# Patient Record
Sex: Female | Born: 1990 | Race: White | Hispanic: No | Marital: Single | Smoking: Never smoker
Health system: Southern US, Community
[De-identification: ages and names within clinical notes are randomized; demographics above are authoritative.]

## PROBLEM LIST (undated history)

## (undated) DIAGNOSIS — N926 Irregular menstruation, unspecified: Secondary | ICD-10-CM

## (undated) DIAGNOSIS — N930 Postcoital and contact bleeding: Secondary | ICD-10-CM

## (undated) DIAGNOSIS — E669 Obesity, unspecified: Secondary | ICD-10-CM

## (undated) DIAGNOSIS — L732 Hidradenitis suppurativa: Secondary | ICD-10-CM

## (undated) DIAGNOSIS — Z8619 Personal history of other infectious and parasitic diseases: Secondary | ICD-10-CM

## (undated) DIAGNOSIS — J45909 Unspecified asthma, uncomplicated: Secondary | ICD-10-CM

## (undated) DIAGNOSIS — N898 Other specified noninflammatory disorders of vagina: Secondary | ICD-10-CM

## (undated) DIAGNOSIS — B9689 Other specified bacterial agents as the cause of diseases classified elsewhere: Secondary | ICD-10-CM

## (undated) DIAGNOSIS — A749 Chlamydial infection, unspecified: Secondary | ICD-10-CM

## (undated) DIAGNOSIS — N390 Urinary tract infection, site not specified: Secondary | ICD-10-CM

## (undated) DIAGNOSIS — N760 Acute vaginitis: Secondary | ICD-10-CM

## (undated) DIAGNOSIS — E069 Thyroiditis, unspecified: Secondary | ICD-10-CM

## (undated) HISTORY — DX: Hidradenitis suppurativa: L73.2

## (undated) HISTORY — DX: Irregular menstruation, unspecified: N92.6

## (undated) HISTORY — PX: EYE SURGERY: SHX253

## (undated) HISTORY — DX: Urinary tract infection, site not specified: N39.0

## (undated) HISTORY — DX: Other specified noninflammatory disorders of vagina: N89.8

## (undated) HISTORY — DX: Personal history of other infectious and parasitic diseases: Z86.19

## (undated) HISTORY — DX: Chlamydial infection, unspecified: A74.9

## (undated) HISTORY — DX: Thyroiditis, unspecified: E06.9

## (undated) HISTORY — DX: Postcoital and contact bleeding: N93.0

## (undated) HISTORY — DX: Other specified bacterial agents as the cause of diseases classified elsewhere: B96.89

## (undated) HISTORY — DX: Obesity, unspecified: E66.9

## (undated) HISTORY — DX: Acute vaginitis: N76.0

---
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
## 2012-05-05 ENCOUNTER — Encounter (HOSPITAL_COMMUNITY): Payer: Self-pay | Admitting: *Deleted

## 2012-05-05 ENCOUNTER — Emergency Department (HOSPITAL_COMMUNITY)
Admission: EM | Admit: 2012-05-05 | Discharge: 2012-05-05 | Disposition: A | Discharge status: Home / Self Care | Payer: Medicaid Other | Attending: Emergency Medicine | Admitting: Emergency Medicine

## 2012-05-05 DIAGNOSIS — J039 Acute tonsillitis, unspecified: Secondary | ICD-10-CM | POA: Insufficient documentation

## 2012-05-05 DIAGNOSIS — J45909 Unspecified asthma, uncomplicated: Secondary | ICD-10-CM | POA: Insufficient documentation

## 2012-05-05 DIAGNOSIS — R509 Fever, unspecified: Secondary | ICD-10-CM | POA: Insufficient documentation

## 2012-05-05 HISTORY — DX: Unspecified asthma, uncomplicated: J45.909

## 2012-05-05 LAB — RAPID STREP SCREEN (MED CTR MEBANE ONLY): Streptococcus, Group A Screen (Direct): NEGATIVE

## 2012-05-05 MED ORDER — ACETAMINOPHEN 500 MG PO TABS
1000.0000 mg | ORAL_TABLET | Freq: Once | ORAL | Status: AC
  Administered 2012-05-05: 1000 mg via ORAL
  Filled 2012-05-05: qty 2

## 2012-05-05 MED ORDER — AMOXICILLIN 250 MG PO CAPS
500.0000 mg | ORAL_CAPSULE | Freq: Once | ORAL | Status: AC
  Administered 2012-05-05: 500 mg via ORAL
  Filled 2012-05-05: qty 2

## 2012-05-05 MED ORDER — AMOXICILLIN 500 MG PO CAPS: 500.0000 mg | ORAL_CAPSULE | Freq: Three times a day (TID) | ORAL | Status: AC

## 2012-05-05 NOTE — ED Provider Notes (Signed)
History     CSN: [id number]  Arrival date & time 05/05/12  1521   First MD Initiated Contact with Patient 05/05/12 1540      Chief Complaint  Patient presents with  . Sore Throat    (Consider location/radiation/quality/duration/timing/severity/associated sxs/prior treatment) HPI Comments: Diane Parks presents with a 1 week history of sore throat,  Headache and low grade fevers with nausea.  She also reports shaking chills.  She denies vomiting,  Urinary complaints,  Cough, nasal congestion and  shortness of breath.  She has taken no medicines for her symptoms.  She has found no alleviators,  Swallowing makes the pain worse.  The history is provided by the patient.    Past Medical History  Diagnosis Date  . Asthma     Past Surgical History  Procedure Date  . Eye surgery     History reviewed. No pertinent family history.  History  Substance Use Topics  . Smoking status: Former Games developer  . Smokeless tobacco: Not on file  . Alcohol Use: No    OB History    Grav Para Term Preterm Abortions TAB SAB Ect Mult Living                  Review of Systems  Constitutional: Negative for fever.  HENT: Positive for sore throat. Negative for congestion, rhinorrhea, neck pain, dental problem, voice change and sinus pressure.   Eyes: Negative.   Respiratory: Negative for chest tightness and shortness of breath.   Cardiovascular: Negative for chest pain.  Gastrointestinal: Negative for nausea and abdominal pain.  Genitourinary: Negative.   Musculoskeletal: Negative for joint swelling and arthralgias.  Skin: Negative.  Negative for rash and wound.  Neurological: Negative for dizziness, weakness, light-headedness, numbness and headaches.  Hematological: Negative.   Psychiatric/Behavioral: Negative.     Allergies  Review of patient's allergies indicates no known allergies.  Home Medications   Current Outpatient Rx  Name Route Sig Dispense Refill  . AMOXICILLIN 500 MG PO  CAPS Oral Take 1 capsule (500 mg total) by mouth 3 (three) times daily. 30 capsule 0    BP 116/62  Pulse 119  Temp 99.8 F (37.7 C) (Oral)  Resp 18  Ht 5\' 1"  (1.549 m)  Wt 180 lb (81.647 kg)  BMI 34.01 kg/m2  SpO2 97%  LMP 04/22/2012  Physical Exam  Nursing note and vitals reviewed. Constitutional: She appears well-developed and well-nourished.  HENT:  Head: Normocephalic and atraumatic.  Right Ear: Tympanic membrane and external ear normal.  Left Ear: Tympanic membrane and external ear normal.  Nose: Nose normal.  Mouth/Throat: Oropharyngeal exudate and posterior oropharyngeal erythema present.  Eyes: Conjunctivae are normal.  Neck: Normal range of motion. No tracheal deviation present. No thyromegaly present.  Cardiovascular: Normal rate, regular rhythm, normal heart sounds and intact distal pulses.   Pulmonary/Chest: Effort normal and breath sounds normal. No stridor. She has no wheezes.  Abdominal: Soft. Bowel sounds are normal. There is no tenderness.  Musculoskeletal: Normal range of motion.  Lymphadenopathy:    She has cervical adenopathy.  Neurological: She is alert.  Skin: Skin is warm and dry.  Psychiatric: She has a normal mood and affect.    ED Course  Procedures (including critical care time)   Labs Reviewed  RAPID STREP SCREEN   No results found.   1. Tonsillitis with exudate       MDM  Amoxicillin,  Ibuprofen.  Rest,  Fluids. Recheck if not improving,  Resource guide given.  Burgess Amor, [place] 05/05/12 1646

## 2012-05-05 NOTE — ED Notes (Signed)
Pt c/o headache, sore throat, nausea and shaking for a week. States that she thinks she has had a fever. Pt denies vomiting.

## 2012-05-06 NOTE — ED Provider Notes (Signed)
Medical screening examination/treatment/procedure(s) were performed by non-physician practitioner and as supervising physician I was immediately available for consultation/collaboration.   Laray Anger, DO 05/06/12 Windell Moment

## 2012-08-25 ENCOUNTER — Emergency Department (HOSPITAL_COMMUNITY)
Admission: EM | Admit: 2012-08-25 | Discharge: 2012-08-25 | Disposition: A | Discharge status: Home / Self Care | Payer: Self-pay | Attending: Emergency Medicine | Admitting: Emergency Medicine

## 2012-08-25 ENCOUNTER — Encounter (HOSPITAL_COMMUNITY): Payer: Self-pay

## 2012-08-25 DIAGNOSIS — H6120 Impacted cerumen, unspecified ear: Secondary | ICD-10-CM | POA: Insufficient documentation

## 2012-08-25 DIAGNOSIS — Z87891 Personal history of nicotine dependence: Secondary | ICD-10-CM | POA: Insufficient documentation

## 2012-08-25 DIAGNOSIS — J45909 Unspecified asthma, uncomplicated: Secondary | ICD-10-CM | POA: Insufficient documentation

## 2012-08-25 NOTE — ED Notes (Signed)
Irrigated right ear, states she is able to hear now, advised to use ear wax drops in the future

## 2012-08-25 NOTE — ED Notes (Signed)
Complain of pain in both ears for two weeks. States right hurts worse than left

## 2012-08-26 NOTE — ED Provider Notes (Signed)
History     CSN: [id number]  Arrival date & time 08/25/12  1354   First MD Initiated Contact with Patient 08/25/12 1545      Chief Complaint  Patient presents with  . Otalgia    (Consider location/radiation/quality/duration/timing/severity/associated sxs/prior treatment) HPI Comments: Patient has been applying peroxide to the right ear daily for the past week.  Patient is a 21 y.o. female presenting with ear pain. The history is provided by the patient.  Otalgia The current episode started more than 1 week ago. There is pain in both ears. The problem occurs constantly. The problem has been gradually worsening. There has been no fever. The pain is at a severity of 6/10. Associated symptoms include hearing loss. Pertinent negatives include no ear discharge, no headaches, no rhinorrhea, no sore throat and no neck pain. Associated symptoms comments: Decreased hearing acuity in right ear along with worse pain in the right ear..    Past Medical History  Diagnosis Date  . Asthma     Past Surgical History  Procedure Date  . Eye surgery     No family history on file.  History  Substance Use Topics  . Smoking status: Former Games developer  . Smokeless tobacco: Not on file  . Alcohol Use: No    OB History    Grav Para Term Preterm Abortions TAB SAB Ect Mult Living                  Review of Systems  HENT: Positive for hearing loss and ear pain. Negative for sore throat, rhinorrhea, neck pain and ear discharge.   Neurological: Negative for headaches.    Allergies  Review of patient's allergies indicates no known allergies.  Home Medications  No current outpatient prescriptions on file.  BP 118/70  Pulse 92  Temp 98.3 F (36.8 C) (Oral)  Resp 18  Ht 5\' 1"  (1.549 m)  Wt 180 lb (81.647 kg)  BMI 34.01 kg/m2  SpO2 100%  LMP 08/04/2012  Physical Exam  Constitutional: She is oriented to person, place, and time. She appears well-developed and well-nourished.  HENT:  Head:  Normocephalic and atraumatic.  Right Ear: External ear normal. Decreased hearing is noted.  Left Ear: External ear normal. No decreased hearing is noted.  Nose: Mucosal edema and rhinorrhea present.  Mouth/Throat: Uvula is midline, oropharynx is clear and moist and mucous membranes are normal. No oropharyngeal exudate, posterior oropharyngeal edema, posterior oropharyngeal erythema or tonsillar abscesses.       Cerumen impaction right,  Partial impaction left,  But able to visualize TM.    Eyes: Conjunctivae normal are normal.  Pulmonary/Chest: Effort normal. No respiratory distress. She has no wheezes. She has no rales.  Abdominal: Soft. There is no tenderness.  Musculoskeletal: Normal range of motion.  Neurological: She is alert and oriented to person, place, and time.  Skin: Skin is warm and dry. No rash noted.  Psychiatric: She has a normal mood and affect.    ED Course  Procedures (including critical care time)  Labs Reviewed - No data to display No results found.   1. Cerumen impaction     Cerumen removed per flushing with warm tap water by RN (right ear) - with complete removal of cerumen plug. Resolved pain and hearing  Improved post procedure.  Re-exam - normal TM,  Clear external canal.  Attempt to flush left ear with no successful removal of wax. MDM  Cerumen impaction.  Pt encouraged to apply peroxide to left  ear daily for 5 minutes to soften this side,  See pcp for flushing of the left ear.  Pt agreeable with plan.        Burgess Amor, [place] 08/26/12 1654

## 2012-08-28 NOTE — ED Provider Notes (Signed)
Medical screening examination/treatment/procedure(s) were performed by non-physician practitioner and as supervising physician I was immediately available for consultation/collaboration.   Makaelah Cranfield L Aissatou Fronczak, MD 08/28/12 1448 

## 2012-09-08 ENCOUNTER — Emergency Department (HOSPITAL_COMMUNITY)
Admission: EM | Admit: 2012-09-08 | Discharge: 2012-09-08 | Disposition: A | Discharge status: Home / Self Care | Payer: Medicaid Other | Attending: Emergency Medicine | Admitting: Emergency Medicine

## 2012-09-08 ENCOUNTER — Emergency Department (HOSPITAL_COMMUNITY): Payer: Medicaid Other

## 2012-09-08 ENCOUNTER — Encounter (HOSPITAL_COMMUNITY): Payer: Self-pay | Admitting: *Deleted

## 2012-09-08 DIAGNOSIS — R102 Pelvic and perineal pain: Secondary | ICD-10-CM

## 2012-09-08 DIAGNOSIS — R52 Pain, unspecified: Secondary | ICD-10-CM

## 2012-09-08 DIAGNOSIS — Z87891 Personal history of nicotine dependence: Secondary | ICD-10-CM | POA: Insufficient documentation

## 2012-09-08 DIAGNOSIS — N949 Unspecified condition associated with female genital organs and menstrual cycle: Secondary | ICD-10-CM | POA: Insufficient documentation

## 2012-09-08 DIAGNOSIS — R110 Nausea: Secondary | ICD-10-CM | POA: Insufficient documentation

## 2012-09-08 DIAGNOSIS — J45909 Unspecified asthma, uncomplicated: Secondary | ICD-10-CM | POA: Insufficient documentation

## 2012-09-08 LAB — URINALYSIS, ROUTINE W REFLEX MICROSCOPIC
Bilirubin Urine: NEGATIVE
Leukocytes, UA: NEGATIVE
Nitrite: NEGATIVE
Specific Gravity, Urine: 1.02 (ref 1.005–1.030)
Urobilinogen, UA: 0.2 mg/dL (ref 0.0–1.0)
pH: 6.5 (ref 5.0–8.0)

## 2012-09-08 LAB — PREGNANCY, URINE: Preg Test, Ur: NEGATIVE

## 2012-09-08 NOTE — ED Notes (Signed)
Pt alert & oriented x4, stable gait. Patient  given discharge instructions, paperwork & prescription(s). Patient verbalized understanding. Pt left department w/ no further questions. 

## 2012-09-08 NOTE — ED Notes (Signed)
Pt with lower abd pain with nausea x 1 week, nipple tenderness, states she is 3 days late on menses

## 2012-09-08 NOTE — ED Notes (Signed)
Abdominal pain for over a week 

## 2012-09-08 NOTE — ED Provider Notes (Signed)
History     CSN: [id number]  Arrival date & time 09/08/12  2030   First MD Initiated Contact with Patient 09/08/12 2133      Chief Complaint  Patient presents with  . Abdominal Pain    (Consider location/radiation/quality/duration/timing/severity/associated sxs/prior treatment) HPI Comments: "sharp" intermittent lower abd pain.  No UTI sxs.  LMP a couple weeks ago and normal.  No vaginal bleeding or d/c.  No vomiting or diarrhea.eating and drinking normally.  No fever or chills.  No previous abd surgeries.no h/o ovarian cysts.  Not taking any meds for sxs.  Has PCP in [place] but has not called or made an appt to see her "because she is all the way up in [place]".  Patient is a 21 y.o. female presenting with abdominal pain. The history is provided by the patient. No language interpreter was used.  Abdominal Pain The primary symptoms of the illness include abdominal pain and nausea. The primary symptoms of the illness do not include fever, vomiting, diarrhea, hematemesis, hematochezia, dysuria, vaginal discharge or vaginal bleeding. Episode onset: 2-3 weeks ago. The onset of the illness was gradual.  The patient states that she believes she is currently not pregnant. The patient has not had a change in bowel habit. Symptoms associated with the illness do not include chills, anorexia, diaphoresis, constipation, urgency, hematuria, frequency or back pain. Significant associated medical issues do not include inflammatory bowel disease, diabetes or diverticulitis.    Past Medical History  Diagnosis Date  . Asthma     Past Surgical History  Procedure Date  . Eye surgery     No family history on file.  History  Substance Use Topics  . Smoking status: Former Games developer  . Smokeless tobacco: Not on file  . Alcohol Use: No    OB History    Grav Para Term Preterm Abortions TAB SAB Ect Mult Living                  Review of Systems  Constitutional: Negative for fever, chills  and diaphoresis.  Gastrointestinal: Positive for nausea and abdominal pain. Negative for vomiting, diarrhea, constipation, hematochezia, anorexia and hematemesis.  Genitourinary: Negative for dysuria, urgency, frequency, hematuria, flank pain, vaginal bleeding, vaginal discharge and vaginal pain.  Musculoskeletal: Negative for back pain.  All other systems reviewed and are negative.    Allergies  Review of patient's allergies indicates no known allergies.  Home Medications  No current outpatient prescriptions on file.  BP 112/56  Pulse 85  Temp 97.2 F (36.2 C)  Resp 18  Ht 5\' 1"  (1.549 m)  Wt 190 lb (86.183 kg)  BMI 35.90 kg/m2  SpO2 100%  LMP 08/04/2012  Physical Exam  Nursing note and vitals reviewed. Constitutional: She is oriented to person, place, and time. She appears well-developed and well-nourished. No distress.  HENT:  Head: Normocephalic and atraumatic.  Eyes: EOM are normal.  Neck: Normal range of motion.  Cardiovascular: Normal rate and regular rhythm.   Pulmonary/Chest: Effort normal.  Abdominal: Soft. Normal appearance and bowel sounds are normal. She exhibits no distension and no mass. There is tenderness in the suprapubic area. There is no rebound, no guarding, no tenderness at McBurney's point and negative Murphy's sign.         Pt is obese.  Genitourinary: Vagina normal. There is no rash, tenderness, lesion or injury on the right labia. There is no rash, tenderness, lesion or injury on the left labia. No erythema, tenderness or bleeding around  the vagina. No foreign body around the vagina. No signs of injury around the vagina. No vaginal discharge found.       On bimanual exam pt has mid pelvic PT but only minimal adnexal PT.    Musculoskeletal: Normal range of motion.  Neurological: She is alert and oriented to person, place, and time.  Skin: Skin is warm and dry.  Psychiatric: She has a normal mood and affect. Judgment normal.    ED Course    Procedures (including critical care time)  Labs Reviewed  WET PREP, GENITAL - Abnormal; Notable for the following:    WBC, Wet Prep HPF POC FEW (*)     All other components within normal limits  URINALYSIS, ROUTINE W REFLEX MICROSCOPIC  PREGNANCY, URINE  GC/CHLAMYDIA PROBE AMP, GENITAL  RPR   Dg Abd 1 View  09/08/2012  *RADIOLOGY REPORT*  Clinical Data: 2-3 week history of lower abdominal pain.  ABDOMEN - 1 VIEW  Comparison: None.  Findings: Bowel gas pattern unremarkable without evidence of obstruction or significant ileus.  Calcification in the left side of the low pelvis which is oval in shape, rather than the typical round phlebolith shape; there is a smaller round calcification immediately below this which is likely a phlebolith.  No other abnormal calcifications.  Regional skeleton unremarkable.  IMPRESSION: Possible distal left ureteral calculus (versus an oval phlebolith). Does the patient have left flank pain?  Otherwise, normal examination.   Original Report Authenticated By: Hulan Saas, M.D.      1. Pelvic pain in female       MDM  all labs are normal.   Scheduled for pelvic u/s tomorrow at 1500.  Return to the ED for disposition.        Evalina Field, [place] 09/08/12 [phone]

## 2012-09-09 ENCOUNTER — Other Ambulatory Visit (HOSPITAL_COMMUNITY): Payer: Self-pay | Admitting: Emergency Medicine

## 2012-09-09 ENCOUNTER — Ambulatory Visit (HOSPITAL_COMMUNITY)
Admit: 2012-09-09 | Discharge: 2012-09-09 | Disposition: A | Discharge status: Home / Self Care | Payer: Medicaid Other | Attending: Emergency Medicine | Admitting: Emergency Medicine

## 2012-09-09 DIAGNOSIS — R102 Pelvic and perineal pain: Secondary | ICD-10-CM

## 2012-09-09 DIAGNOSIS — R52 Pain, unspecified: Secondary | ICD-10-CM

## 2012-09-09 DIAGNOSIS — N949 Unspecified condition associated with female genital organs and menstrual cycle: Secondary | ICD-10-CM | POA: Insufficient documentation

## 2012-09-09 NOTE — ED Provider Notes (Signed)
Medical screening examination/treatment/procedure(s) were performed by non-physician practitioner and as supervising physician I was immediately available for consultation/collaboration.  Kenwood Rosiak L Bryler Dibble, MD 09/09/12 0143 

## 2012-09-09 NOTE — ED Provider Notes (Signed)
Patient informed of [place] results.  No new complaints.  Patient will f/u w OB/GYN  Gerhard Munch, MD 09/09/12 [phone]

## 2012-09-10 LAB — GC/CHLAMYDIA PROBE AMP, GENITAL
Chlamydia, DNA Probe: NEGATIVE
GC Probe Amp, Genital: NEGATIVE

## 2013-02-11 ENCOUNTER — Ambulatory Visit (INDEPENDENT_AMBULATORY_CARE_PROVIDER_SITE_OTHER): Payer: Medicaid Other | Admitting: Obstetrics & Gynecology

## 2013-02-11 ENCOUNTER — Encounter: Payer: Self-pay | Admitting: Obstetrics & Gynecology

## 2013-02-11 VITALS — BP 120/80 | Ht 61.0 in | Wt 205.0 lb

## 2013-02-11 DIAGNOSIS — Z3202 Encounter for pregnancy test, result negative: Secondary | ICD-10-CM

## 2013-02-11 DIAGNOSIS — Z3200 Encounter for pregnancy test, result unknown: Secondary | ICD-10-CM

## 2013-02-11 LAB — POCT URINE PREGNANCY: Preg Test, Ur: NEGATIVE

## 2013-02-17 ENCOUNTER — Encounter: Payer: Self-pay | Admitting: Obstetrics & Gynecology

## 2013-02-17 ENCOUNTER — Encounter: Payer: Medicaid Other | Admitting: Obstetrics & Gynecology

## 2013-02-17 VITALS — BP 90/60 | Ht 61.0 in | Wt 208.0 lb

## 2013-02-18 ENCOUNTER — Encounter: Payer: Self-pay | Admitting: Obstetrics & Gynecology

## 2013-02-18 ENCOUNTER — Ambulatory Visit (INDEPENDENT_AMBULATORY_CARE_PROVIDER_SITE_OTHER): Payer: Medicaid Other | Admitting: Obstetrics & Gynecology

## 2013-02-18 VITALS — BP 110/70 | Ht 61.0 in | Wt 204.0 lb

## 2013-02-18 DIAGNOSIS — N926 Irregular menstruation, unspecified: Secondary | ICD-10-CM

## 2013-02-18 DIAGNOSIS — N912 Amenorrhea, unspecified: Secondary | ICD-10-CM

## 2013-02-18 DIAGNOSIS — Z3202 Encounter for pregnancy test, result negative: Secondary | ICD-10-CM

## 2013-02-18 LAB — POCT URINE PREGNANCY: Preg Test, Ur: NEGATIVE

## 2013-02-18 MED ORDER — NORGESTIM-ETH ESTRAD TRIPHASIC 0.18/0.215/0.25 MG-25 MCG PO TABS: 1.0000 | ORAL_TABLET | Freq: Every day | ORAL | Status: DC

## 2013-02-18 NOTE — Patient Instructions (Signed)
Polycystic Ovarian Syndrome Polycystic ovarian syndrome is a condition with a number of problems. One problem is with the ovaries. The ovaries are organs located in the female pelvis, on each side of the uterus. Usually, during the menstrual cycle, an egg is released from 1 ovary every month. This is called ovulation. When the egg is fertilized, it goes into the womb (uterus), which allows for the growth of a baby. The egg travels from the ovary through the fallopian tube to the uterus. The ovaries also make the hormones estrogen and progesterone. These hormones help the development of a woman's breasts, body shape, and body hair. They also regulate the menstrual cycle and pregnancy. Sometimes, cysts form in the ovaries. A cyst is a fluid-filled sac. On the ovary, different types of cysts can form. The most common type of ovarian cyst is called a functional or ovulation cyst. It is normal, and often forms during the normal menstrual cycle. Each month, a woman's ovaries grow tiny cysts that hold the eggs. When an egg is fully grown, the sac breaks open. This releases the egg. Then, the sac which released the egg from the ovary dissolves. In one type of functional cyst, called a follicle cyst, the sac does not break open to release the egg. It may actually continue to grow. This type of cyst usually disappears within 1 to 3 months.  One type of cyst problem with the ovaries is called Polycystic Ovarian Syndrome (PCOS). In this condition, many follicle cysts form, but do not rupture and produce an egg. This health problem can affect the following:  Menstrual cycle.  Heart.  Obesity.  Cancer of the uterus.  Fertility.  Blood vessels.  Hair growth (face and body) or baldness.  Hormones.  Appearance.  High blood pressure.  Stroke.  Insulin production.  Inflammation of the liver.  Elevated blood cholesterol and triglycerides. CAUSES   No one knows the exact cause of PCOS.  Women with  PCOS often have a mother or sister with PCOS. There is not yet enough proof to say this is inherited.  Many women with PCOS have a weight problem.  Researchers are looking at the relationship between PCOS and the body's ability to make insulin. Insulin is a hormone that regulates the change of sugar, starches, and other food into energy for the body's use, or for storage. Some women with PCOS make too much insulin. It is possible that the ovaries react by making too many female hormones, called androgens. This can lead to acne, excessive hair growth, weight gain, and ovulation problems.  Too much production of luteinizing hormone (LH) from the pituitary gland in the brain stimulates the ovary to produce too much female hormone (androgen). SYMPTOMS   Infrequent or no menstrual periods, and/or irregular bleeding.  Inability to get pregnant (infertility), because of not ovulating.  Increased growth of hair on the face, chest, stomach, back, thumbs, thighs, or toes.  Acne, oily skin, or dandruff.  Pelvic pain.  Weight gain or obesity, usually carrying extra weight around the waist.  Type 2 diabetes (this is the diabetes that usually does not need insulin).  High cholesterol.  High blood pressure.  Female-pattern baldness or thinning hair.  Patches of thickened and dark brown or black skin on the neck, arms, breasts, or thighs.  Skin tags, or tiny excess flaps of skin, in the armpits or neck area.  Sleep apnea (excessive snoring and breathing stops at times while asleep).  Deepening of the voice.    Gestational diabetes when pregnant.  Increased risk of miscarriage with pregnancy. DIAGNOSIS  There is no single test to diagnose PCOS.   Your caregiver will:  Take a medical history.  Perform a pelvic exam.  Perform an ultrasound.  Check your female and female hormone levels.  Measure glucose or sugar levels in the blood.  Do other blood tests.  If you are producing too many  female hormones, your caregiver will make sure it is from PCOS. At the physical exam, your caregiver will want to evaluate the areas of increased hair growth. Try to allow natural hair growth for a few days before the visit.  During a pelvic exam, the ovaries may be enlarged or swollen by the increased number of small cysts. This can be seen more easily by vaginal ultrasound or screening, to examine the ovaries and lining of the uterus (endometrium) for cysts. The uterine lining may become thicker, if there has not been a regular period. TREATMENT  Because there is no cure for PCOS, it needs to be managed to prevent problems. Treatments are based on your symptoms. Treatment is also based on whether you want to have a baby or whether you need contraception.  Treatment may include:  Progesterone hormone, to start a menstrual period.  Birth control pills, to make you have regular menstrual periods.  Medicines to make you ovulate, if you want to get pregnant.  Medicines to control your insulin.  Medicine to control your blood pressure.  Medicine and diet, to control your high cholesterol and triglycerides in your blood.  Surgery, making small holes in the ovary, to decrease the amount of female hormone production. This is done through a long, lighted tube (laparoscope), placed into the pelvis through a tiny incision in the lower abdomen. Your caregiver will go over some of the choices with you. WOMEN WITH PCOS HAVE THESE CHARACTERISTICS:  High levels of female hormones called androgens.  An irregular or no menstrual cycle.  May have many small cysts in their ovaries. PCOS is the most common hormonal reproductive problem in women of childbearing age. WHY DO WOMEN WITH PCOS HAVE TROUBLE WITH THEIR MENSTRUAL CYCLE? Each month, about 20 eggs start to mature in the ovaries. As one egg grows and matures, the follicle breaks open to release the egg, so it can travel through the fallopian tube for  fertilization. When the single egg leaves the follicle, ovulation takes place. In women with PCOS, the ovary does not make all of the hormones it needs for any of the eggs to fully mature. They may start to grow and accumulate fluid, but no one egg becomes large enough. Instead, some may remain as cysts. Since no egg matures or is released, ovulation does not occur and the hormone progesterone is not made. Without progesterone, a woman's menstrual cycle is irregular or absent. Also, the cysts produce female hormones, which continue to prevent ovulation.  Document Released: 02/14/2005 Document Revised: 01/13/2012 Document Reviewed: 09/08/2009 [employer] Patient Information 2013 [employer]  

## 2013-02-18 NOTE — Progress Notes (Signed)
Patient ID: Alenah Sarria, female   DOB: 1991/01/22, 22 y.o.   MRN: [id number] Lifelong history of irregular menses, goes a year or so without periods. Negative pregnancy test never been pregnant Sonogram normal, interestingly not consistent with PCOS on sonogram.  Exam mildly androgenized probably due to lack of estrogenic levels No pelvic indicated  IMP Anovulatory chronic amenorrhea Mildly androgenized  Plan Discussed in detail with patient Start tri phasil ocp F/u in 6 months, pt knows she has to stay on pills

## 2013-02-24 NOTE — Progress Notes (Signed)
Patient ID: Diane Parks, female   DOB: July 23, 1991, 22 y.o.   MRN: [id number] Erroneous visit

## 2013-09-09 ENCOUNTER — Other Ambulatory Visit: Payer: Self-pay

## 2013-10-12 ENCOUNTER — Ambulatory Visit: Payer: Medicaid Other | Admitting: Adult Health

## 2013-11-12 ENCOUNTER — Emergency Department (HOSPITAL_COMMUNITY)
Admission: EM | Admit: 2013-11-12 | Discharge: 2013-11-12 | Disposition: A | Discharge status: Home / Self Care | Payer: Self-pay | Attending: Emergency Medicine | Admitting: Emergency Medicine

## 2013-11-12 ENCOUNTER — Encounter (HOSPITAL_COMMUNITY): Payer: Self-pay | Admitting: Emergency Medicine

## 2013-11-12 ENCOUNTER — Emergency Department (HOSPITAL_COMMUNITY): Payer: Self-pay

## 2013-11-12 DIAGNOSIS — J45909 Unspecified asthma, uncomplicated: Secondary | ICD-10-CM | POA: Insufficient documentation

## 2013-11-12 DIAGNOSIS — R109 Unspecified abdominal pain: Secondary | ICD-10-CM | POA: Insufficient documentation

## 2013-11-12 DIAGNOSIS — N926 Irregular menstruation, unspecified: Secondary | ICD-10-CM | POA: Insufficient documentation

## 2013-11-12 DIAGNOSIS — Z791 Long term (current) use of non-steroidal anti-inflammatories (NSAID): Secondary | ICD-10-CM | POA: Insufficient documentation

## 2013-11-12 DIAGNOSIS — Z3202 Encounter for pregnancy test, result negative: Secondary | ICD-10-CM | POA: Insufficient documentation

## 2013-11-12 LAB — WET PREP, GENITAL
Clue Cells Wet Prep HPF POC: NONE SEEN
Trich, Wet Prep: NONE SEEN
YEAST WET PREP: NONE SEEN

## 2013-11-12 LAB — URINALYSIS, ROUTINE W REFLEX MICROSCOPIC
Bilirubin Urine: NEGATIVE
GLUCOSE, UA: NEGATIVE mg/dL
Ketones, ur: NEGATIVE mg/dL
Nitrite: POSITIVE — AB
PH: 5.5 (ref 5.0–8.0)
PROTEIN: NEGATIVE mg/dL
Urobilinogen, UA: 0.2 mg/dL (ref 0.0–1.0)

## 2013-11-12 LAB — PREGNANCY, URINE: Preg Test, Ur: NEGATIVE

## 2013-11-12 LAB — URINE MICROSCOPIC-ADD ON

## 2013-11-12 MED ORDER — NAPROXEN 500 MG PO TABS: 500.0000 mg | ORAL_TABLET | Freq: Two times a day (BID) | ORAL | Status: DC

## 2013-11-12 NOTE — ED Notes (Signed)
abd pain and sob for 2 days, nausea, no vomiting, no diarrhea,  No fever.

## 2013-11-12 NOTE — ED Notes (Signed)
Pt unable to void at this time.  Informed of needed urine sample.  Verbalized understanding.

## 2013-11-12 NOTE — Discharge Instructions (Signed)
Ultrasound showed no new findings.  Follow up with ob gyn dr.   Phone number given.  meds for pain

## 2013-11-12 NOTE — ED Provider Notes (Signed)
CSN: [id number]     Arrival date & time 11/12/13  1123 History  This chart was scribed for Donnetta Hutching, MD by Quintella Reichert, ED scribe.  This patient was seen in room APA08/APA08 and the patient's care was started at 1:34 PM.   Chief Complaint  Patient presents with  . Abdominal Pain    The history is provided by the patient. No language interpreter was used.    HPI Comments: Diane Parks is a 23 y.o. female with h/o asthma who presents to the Emergency Department complaining of 2 days of constant moderate bilateral lower abdominal pain equal on both sides.  Pt denies prior h/o similar pain.  She has not attempted to treat pain at home.  She denies vomiting, diarrhea, fever, vaginal bleeding or discharge, decreased appetite, or urinary symptoms.  LNMP was in May 2014 but she states she normally does have irregular periods.  She has unprotected sex and admits to possibility of pregnancy.  She denies known h/o GU or GI issues.   Past Medical History  Diagnosis Date  . Asthma     Past Surgical History  Procedure Laterality Date  . Eye surgery    . Eye surgery      History reviewed. No pertinent family history.   History  Substance Use Topics  . Smoking status: Never Smoker   . Smokeless tobacco: Not on file  . Alcohol Use: No    OB History   Grav Para Term Preterm Abortions TAB SAB Ect Mult Living                  Review of Systems A complete 10 system review of systems was obtained and all systems are negative except as noted in the HPI and PMH.    Allergies  Review of patient's allergies indicates no known allergies.  Home Medications   Current Outpatient Rx  Name  Route  Sig  Dispense  Refill  . naproxen (NAPROSYN) 500 MG tablet   Oral   Take 1 tablet (500 mg total) by mouth 2 (two) times daily.   20 tablet   0     BP 106/49  Pulse 87  Temp(Src) 98.2 F (36.8 C) (Oral)  Resp 20  Ht 5\' 1"  (1.549 m)  Wt 190 lb (86.183 kg)  BMI 35.92 kg/m2  SpO2  97%  LMP 03/12/2013  Physical Exam  Nursing note and vitals reviewed. Constitutional: She is oriented to person, place, and time. She appears well-developed and well-nourished.  HENT:  Head: Normocephalic and atraumatic.  Eyes: Conjunctivae and EOM are normal. Pupils are equal, round, and reactive to light.  Neck: Normal range of motion. Neck supple.  Cardiovascular: Normal rate, regular rhythm and normal heart sounds.   Pulmonary/Chest: Effort normal and breath sounds normal.  Abdominal: Soft. Bowel sounds are normal. There is tenderness (Minimal lower abdominal tenderness).  Musculoskeletal: Normal range of motion.  Neurological: She is alert and oriented to person, place, and time.  Skin: Skin is warm and dry.  Psychiatric: She has a normal mood and affect. Her behavior is normal.    ED Course  Procedures (including critical care time)  DIAGNOSTIC STUDIES: Oxygen Saturation is 97% on room air, normal by my interpretation.    COORDINATION OF CARE: 1:36 PM-Discussed treatment plan which includes UA and pelvic exam with pt at bedside and pt agreed to plan.    Labs Review Labs Reviewed  WET PREP, GENITAL - Abnormal; Notable for the following:  WBC, Wet Prep HPF POC FEW (*)    All other components within normal limits  URINALYSIS, ROUTINE W REFLEX MICROSCOPIC - Abnormal; Notable for the following:    APPearance HAZY (*)    Specific Gravity, Urine >1.030 (*)    Hgb urine dipstick TRACE (*)    Nitrite POSITIVE (*)    Leukocytes, UA SMALL (*)    All other components within normal limits  URINE MICROSCOPIC-ADD ON - Abnormal; Notable for the following:    Squamous Epithelial / LPF MANY (*)    Bacteria, UA MANY (*)    All other components within normal limits  URINE CULTURE  GC/CHLAMYDIA PROBE AMP  PREGNANCY, URINE   Imaging Review [place] Transvaginal Non-ob  11/12/2013   CLINICAL DATA:  Irregular menses  EXAM: TRANSABDOMINAL AND TRANSVAGINAL ULTRASOUND OF PELVIS  TECHNIQUE:  Both transabdominal and transvaginal ultrasound examinations of the pelvis were performed. Transabdominal technique was performed for global imaging of the pelvis including uterus, ovaries, adnexal regions, and pelvic cul-de-sac. It was necessary to proceed with endovaginal exam following the transabdominal exam to visualize the uterus, endometrium and ovaries to better advantage.  COMPARISON:  09/09/2012  FINDINGS: Uterus  Measurements: 6.2 cm x 2.8 cm x 3.2 cm. No fibroids or other mass visualized.  Endometrium  Thickness: 5.9 mm. In the lower uterine segment, there is irregular cystic change similar to the prior exam. There are adjacent nabothian cysts in the upper cervix. No endometrial fluid.  Right ovary  Measurements: Best seen on transabdominal images, but not well defined on this study. It measures approximately 14 mm by 12 mm x 19 mm. No right adnexal mass.  Left ovary  Measurements: 21 mm x 17 mm x 23 mm. Normal appearance/no adnexal mass.  Other findings  No free fluid.  IMPRESSION: 1. Heterogeneous cystic change along the lower endometrium similar to the prior exam with several adjacent upper cervical nabothian cysts. This is of unclear etiology. If additional imaging characterization is desired clinically, this would be best performed with uterine MRI. 2. Right ovary not particularly well defined on this exam, but normal in overall size. Normal left ovary. Normal adnexa.   Electronically Signed   By: Amie Portlandavid  Ormond M.D.   On: 11/12/2013 16:11   [place] Pelvis Complete  11/12/2013   CLINICAL DATA:  Irregular menses  EXAM: TRANSABDOMINAL AND TRANSVAGINAL ULTRASOUND OF PELVIS  TECHNIQUE: Both transabdominal and transvaginal ultrasound examinations of the pelvis were performed. Transabdominal technique was performed for global imaging of the pelvis including uterus, ovaries, adnexal regions, and pelvic cul-de-sac. It was necessary to proceed with endovaginal exam following the transabdominal exam to visualize  the uterus, endometrium and ovaries to better advantage.  COMPARISON:  09/09/2012  FINDINGS: Uterus  Measurements: 6.2 cm x 2.8 cm x 3.2 cm. No fibroids or other mass visualized.  Endometrium  Thickness: 5.9 mm. In the lower uterine segment, there is irregular cystic change similar to the prior exam. There are adjacent nabothian cysts in the upper cervix. No endometrial fluid.  Right ovary  Measurements: Best seen on transabdominal images, but not well defined on this study. It measures approximately 14 mm by 12 mm x 19 mm. No right adnexal mass.  Left ovary  Measurements: 21 mm x 17 mm x 23 mm. Normal appearance/no adnexal mass.  Other findings  No free fluid.  IMPRESSION: 1. Heterogeneous cystic change along the lower endometrium similar to the prior exam with several adjacent upper cervical nabothian cysts. This is of unclear etiology.  If additional imaging characterization is desired clinically, this would be best performed with uterine MRI. 2. Right ovary not particularly well defined on this exam, but normal in overall size. Normal left ovary. Normal adnexa.   Electronically Signed   By: Amie Portland M.D.   On: 11/12/2013 16:11    EKG Interpretation   None       MDM   1. Abdominal pain    No acute abdomen.   Ultrasound of pelvis shows no acute findings.   Patient referred to local gynecologist.  Discharge medications Naprosyn 500 mg     I personally performed the services described in this documentation, which was scribed in my presence. The recorded information has been reviewed and is accurate.    Donnetta Hutching, MD 11/12/13 1700

## 2013-11-13 LAB — GC/CHLAMYDIA PROBE AMP
CT Probe RNA: NEGATIVE
GC Probe RNA: NEGATIVE

## 2013-11-14 ENCOUNTER — Telehealth (HOSPITAL_COMMUNITY): Payer: Self-pay | Admitting: Emergency Medicine

## 2013-11-14 LAB — URINE CULTURE: Colony Count: >=100000

## 2013-11-14 NOTE — Progress Notes (Signed)
ED Antimicrobial Stewardship Positive Culture Follow Up   Diane Parks is an 23 y.o. female who [facility] with a chief complaint of  Chief Complaint  Patient presents with  . Abdominal Pain    Recent Results (from the past 720 hour(s))  URINE CULTURE     Status: None   Collection Time    11/12/13 12:05 PM      Result Value Range Status   Specimen Description URINE, CLEAN CATCH   Final   Special Requests NONE   Final   Culture  Setup Time     Final   Value: 11/12/2013 19:04     Performed at [facility]   Colony Count     Final   Value: >=100,000 COLONIES/ML     Performed at [facility]   Culture     Final   Value: ESCHERICHIA COLI     Performed at [facility]   Report Status 11/14/2013 FINAL   Final   Organism ID, Bacteria ESCHERICHIA COLI   Final  WET PREP, GENITAL     Status: Abnormal   Collection Time    11/12/13  2:51 PM      Result Value Range Status   Yeast Wet Prep HPF POC NONE SEEN  NONE SEEN Final   Trich, Wet Prep NONE SEEN  NONE SEEN Final   Clue Cells Wet Prep HPF POC NONE SEEN  NONE SEEN Final   WBC, Wet Prep HPF POC FEW (*) NONE SEEN Final  GC/CHLAMYDIA PROBE AMP     Status: None   Collection Time    11/12/13  2:51 PM      Result Value Range Status   CT Probe RNA NEGATIVE  NEGATIVE Final   GC Probe RNA NEGATIVE  NEGATIVE Final   Comment: (NOTE)                                                                                               **Normal Reference Range: Negative**          Assay performed using the Gen-Probe APTIMA COMBO2 (R) Assay.     Acceptable specimen types for this assay include APTIMA Swabs (Unisex,     endocervical, urethral, or vaginal), first void urine, and ThinPrep     liquid based cytology samples.     Performed at [facility]    [x]  Patient discharged originally without antimicrobial agent and treatment is now indicated  New antibiotic prescription: Cipro 250mg  po  bid for 3 days  ED Provider: Trixie [facility]red[facility]Emily [facility]  Harland GermanAndrew Aisha Greenberger, Pharm D 11/14/2013 11:56 AM  Clinical Pharmacist  Phone# [phone]

## 2013-11-14 NOTE — ED Notes (Signed)
Post ED Visit - Positive Culture Follow-up: Successful Patient Follow-Up  Culture assessed and recommendations reviewed by: []  Wes Dulaney, Pharm.D., BCPS []  Celedonio MiyamotoJeremy Frens, Pharm.D., BCPS []  Georgina PillionElizabeth Martin, Pharm.D., BCPS []  Slaughter BeachMinh Pham, [address] Rainbow BoulevardPharm.D., BCPS, AAHIVP []  Estella HuskMichelle Turner, Pharm.D., BCPS, AAHIVP [x]  Harland GermanAndrew Meyer, Pharm.D., BCPS  Positive urine culture  [x]  Patient discharged without antimicrobial prescription and treatment is now indicated []  Organism is resistant to prescribed ED discharge antimicrobial []  Patient with positive blood cultures  Changes discussed with ED provider: Trixie DredgeEmily West PA-C New antibiotic prescription: Cipro 250 mg PO BID x 3 days    Zeb ComfortHolland, Karry Causer 11/14/2013, 1:04 PM

## 2014-03-07 ENCOUNTER — Encounter: Payer: Self-pay | Admitting: Women's Health

## 2014-03-07 ENCOUNTER — Ambulatory Visit (INDEPENDENT_AMBULATORY_CARE_PROVIDER_SITE_OTHER): Payer: Medicaid Other | Admitting: Women's Health

## 2014-03-07 VITALS — BP 130/64 | Ht 60.0 in | Wt 215.5 lb

## 2014-03-07 DIAGNOSIS — Z3202 Encounter for pregnancy test, result negative: Secondary | ICD-10-CM

## 2014-03-07 DIAGNOSIS — E669 Obesity, unspecified: Secondary | ICD-10-CM

## 2014-03-07 DIAGNOSIS — Z3049 Encounter for surveillance of other contraceptives: Secondary | ICD-10-CM

## 2014-03-07 LAB — POCT URINE PREGNANCY: Preg Test, Ur: NEGATIVE

## 2014-03-07 MED ORDER — ETONOGESTREL-ETHINYL ESTRADIOL 0.12-0.015 MG/24HR VA RING: 1.0000 | VAGINAL_RING | Freq: Once | VAGINAL | Status: DC

## 2014-03-07 NOTE — Progress Notes (Signed)
Patient ID: Diane Parks, female   DOB: [birth year], 23 y.o.   MRN: [id number] Subjective:   Diane Parks is a 23 y.o. G0 Caucasian female here for a Family Planning [facility] routine well-woman exam.  No LMP recorded.    Current complaints: reports no period since last may. Was seen by Dr. Despina HiddenEure last April, placed on COCs for same reason, took x 1 month, had period, then stopped taking them b/c she forgot. Had pelvic u/s at that time that did not reveal PCO appearing ovaries.  Menarche @ age 23yo, reports normal regular periods til 23yo, then 2periods/yr. Does report few days of spotting last month, but not regular period flow. Does not desire pregnancy right now, but possibly in future. Denies excessive hair growth.  PCP: Prudy FeelerAngel Jones, [facility]         Social History: Sexual: heterosexual, monogamous Marital Status: engaged Living situation: with partner / significant other Occupation: part-time Conservation officer, naturecashier at [employer] Tobacco/alcohol: no tobacco or etoh use Illicit drugs: no history of illicit drug use  The following portions of the patient's history were reviewed and updated as appropriate: allergies, current medications, past family history, past medical history, past social history, past surgical history and problem list.  Gynecologic History No obstetric history on file.  No LMP recorded. Contraception: none Last Pap: never, <21. Results were: n/a Last mammogram: never. Results were: n/a Last TCS: never  Obstetric History OB History  Gravida Para Term Preterm AB SAB TAB Ectopic Multiple Living  0                 Review of Systems Patient denies any headaches, blurred vision, shortness of breath, chest pain, abdominal pain, problems with bowel movements, urination, or intercourse.  Objective:  BP 130/64  Ht 5' (1.524 m)  Wt 215 lb 8 oz (97.75 kg)  BMI 42.09 kg/m2 Physical Exam  General:  Well developed, well nourished, no acute distress. She is alert  and oriented x3. Skin:  Warm and dry Neck:  Midline trachea, no thyromegaly or nodules Cardiovascular: Regular rate and rhythm, no murmur heard Lungs:  Effort normal, all lung fields clear to auscultation bilaterally Breasts:  Very small, but appears to be Tanner stage 5- areola recesses to general contour of breast. No dominant palpable mass, retraction, or nipple discharge Abdomen:  Soft, non tender, no hepatosplenomegaly or masses Pelvic:  External genitalia is normal in appearance.  The vagina is normal in appearance. The cervix is bulbous, no CMT.  Thin prep pap is not done <21yo. Uterus is felt to be normal size, shape, and contour.  No adnexal masses or tenderness noted. Extremities:  No swelling or varicosities noted Psych:  She has a normal mood and affect  Urine preg test today: neg  Assessment:   Family Planning [facility] Healthy well-woman exam Obesity Probable PCOS STI screen  Plan:  GC/CT from urine, HIV, RPR, HSV2 today for FP Mcaid STI screen Discussed importance of opposing estrogen  In anovulatory cycles w/ progesterone- recommended nuva ring since she forgot to take pills- pt agreeable to this. Does not smoke, no h/o HTN, DVT/PE, CVA, MI, or migraines w/ aura.  Demonstrated nuva ring placement on model- pt returned demonstration.  To call us when she wants to get pregnant.  Advised weight loss F/U w/in next 2wks for TSH and A1C, then 5088yr for physical or sooner if needed Mammogram @21yo  or sooner if problems Colonoscopy @21yo  or sooner if problems  Marge DuncansKimberly Randall Janina Trafton CNM, WHNP-BC  03/07/2014 9:55 AM

## 2014-03-07 NOTE — Patient Instructions (Addendum)
$57.50 for labwork   Ethinyl Estradiol; Etonogestrel vaginal ring- Nuva Ring What is this medicine? ETHINYL ESTRADIOL; ETONOGESTREL (ETH in il es tra DYE ole; et oh noe JES trel) vaginal ring is a flexible, vaginal ring used as a contraceptive (birth control method). This medicine combines two types of female hormones, an estrogen and a progestin. This ring is used to prevent ovulation and pregnancy. Each ring is effective for one month. This medicine may be used for other purposes; ask your health care provider or pharmacist if you have questions. COMMON BRAND NAME(S): NuvaRing What should I tell my health care provider before I take this medicine? They need to know if you have or ever had any of these conditions: -abnormal vaginal bleeding -blood vessel disease or blood clots -breast, cervical, endometrial, ovarian, liver, or uterine cancer -diabetes -gallbladder disease -heart disease or recent heart attack -high blood pressure -high cholesterol -kidney disease -liver disease -migraine headaches -stroke -systemic lupus erythematosus (SLE) -tobacco smoker -an unusual or allergic reaction to estrogens, progestins, other medicines, foods, dyes, or preservatives -pregnant or trying to get pregnant -breast-feeding How should I use this medicine? Insert the ring into your vagina as directed. Follow the directions on the prescription label. The ring will remain place for 3 weeks and is then removed for a 1-week break. A new ring is inserted 1 week after the last ring was removed, on the same day of the week. Do not use more often than directed. A patient package insert for the product will be given with each prescription and refill. Read this sheet carefully each time. The sheet may change frequently. Contact your pediatrician regarding the use of this medicine in children. Special care may be needed. This medicine has been used in female children who have started having menstrual  periods. Overdosage: If you think you have taken too much of this medicine contact a poison control center or emergency room at once. NOTE: This medicine is only for you. Do not share this medicine with others. What if I miss a dose? You will need to replace your vaginal ring once a month as directed. If the ring should slip out, or if you leave it in longer or shorter than you should, contact your health care professional for advice. What may interact with this medicine? -acetaminophen -antibiotics or medicines for infections, especially rifampin, rifabutin, rifapentine, and griseofulvin, and possibly penicillins or tetracyclines -aprepitant -ascorbic acid (vitamin C) -atorvastatin -barbiturate medicines, such as phenobarbital -bosentan -carbamazepine -caffeine -clofibrate -cyclosporine -dantrolene -doxercalciferol -felbamate -grapefruit juice -hydrocortisone -medicines for anxiety or sleeping problems, such as diazepam or temazepam -medicines for diabetes, including pioglitazone -modafinil -mycophenolate -nefazodone -oxcarbazepine -phenytoin -prednisolone -ritonavir or other medicines for HIV infection or AIDS -rosuvastatin -selegiline -soy isoflavones supplements -St. John's wort -tamoxifen or raloxifene -theophylline -thyroid hormones -topiramate -warfarin This list may not describe all possible interactions. Give your health care provider a list of all the medicines, herbs, non-prescription drugs, or dietary supplements you use. Also tell them if you smoke, drink alcohol, or use illegal drugs. Some items may interact with your medicine. What should I watch for while using this medicine? Visit your doctor or health care professional for regular checks on your progress. You will need a regular breast and pelvic exam and Pap smear while on this medicine. Use an additional method of contraception during the first cycle that you use this ring. If you have any reason to  think you are pregnant, stop using this medicine right away and contact  your doctor or health care professional. If you are using this medicine for hormone related problems, it may take several cycles of use to see improvement in your condition. Smoking increases the risk of getting a blood clot or having a stroke while you are using hormonal birth control, especially if you are more than 21 years old. You are strongly advised not to smoke. This medicine can make your body retain fluid, making your fingers, hands, or ankles swell. Your blood pressure can go up. Contact your doctor or health care professional if you feel you are retaining fluid. This medicine can make you more sensitive to the sun. Keep out of the sun. If you cannot avoid being in the sun, wear protective clothing and use sunscreen. Do not use sun lamps or tanning beds/booths. If you wear contact lenses and notice visual changes, or if the lenses begin to feel uncomfortable, consult your eye care specialist. In some women, tenderness, swelling, or minor bleeding of the gums may occur. Notify your dentist if this happens. Brushing and flossing your teeth regularly may help limit this. See your dentist regularly and inform your dentist of the medicines you are taking. If you are going to have elective surgery, you may need to stop using this medicine before the surgery. Consult your health care professional for advice. This medicine does not protect you against HIV infection (AIDS) or any other sexually transmitted diseases. What side effects may I notice from receiving this medicine? Side effects that you should report to your doctor or health care professional as soon as possible: -breast tissue changes or discharge -changes in vaginal bleeding during your period or between your periods -chest pain -coughing up blood -dizziness or fainting spells -headaches or migraines -leg, arm or groin pain -severe or sudden headaches -stomach  pain (severe) -sudden shortness of breath -sudden loss of coordination, especially on one side of the body -speech problems -symptoms of vaginal infection like itching, irritation or unusual discharge -tenderness in the upper abdomen -vomiting -weakness or numbness in the arms or legs, especially on one side of the body -yellowing of the eyes or skin Side effects that usually do not require medical attention (report to your doctor or health care professional if they continue or are bothersome): -breakthrough bleeding and spotting that continues beyond the 3 initial cycles of pills -breast tenderness -mood changes, anxiety, depression, frustration, anger, or emotional outbursts -increased sensitivity to sun or ultraviolet light -nausea -skin rash, acne, or brown spots on the skin -weight gain (slight) This list may not describe all possible side effects. Call your doctor for medical advice about side effects. You may report side effects to FDA at 1-800-FDA-1088. Where should I keep my medicine? Keep out of the reach of children. Store at room temperature between 15 and 30 degrees C (59 and 86 degrees F) for up to 4 months. The product will expire after 4 months. Protect from light. Throw away any unused medicine after the expiration date. NOTE: This sheet is a summary. It may not cover all possible information. If you have questions about this medicine, talk to your doctor, pharmacist, or health care provider.  2014, [employer]vier/Gold Standard. (2008-10-06 12:03:58)

## 2014-03-08 LAB — GC/CHLAMYDIA PROBE AMP
CT PROBE, AMP APTIMA: NEGATIVE
GC Probe RNA: NEGATIVE

## 2014-03-08 LAB — HSV 2 ANTIBODY, IGG

## 2014-03-08 LAB — HIV ANTIBODY (ROUTINE TESTING W REFLEX): HIV: NONREACTIVE

## 2014-03-08 LAB — RPR

## 2014-04-13 ENCOUNTER — Telehealth: Payer: Self-pay | Admitting: *Deleted

## 2014-04-14 ENCOUNTER — Telehealth: Payer: Self-pay | Admitting: Obstetrics and Gynecology

## 2014-04-14 NOTE — Telephone Encounter (Signed)
appt scheduled with Joellyn Haff, CNM

## 2014-04-14 NOTE — Telephone Encounter (Signed)
Pt states she is having trouble with the nuvaring staying in place. Call transferred to front staff for an appt to be scheduled with Joellyn Haff, CNM.

## 2014-04-20 ENCOUNTER — Ambulatory Visit: Payer: Medicaid Other | Admitting: Women's Health

## 2014-04-25 ENCOUNTER — Ambulatory Visit: Payer: Medicaid Other | Admitting: Women's Health

## 2014-05-03 ENCOUNTER — Encounter: Payer: Self-pay | Admitting: Women's Health

## 2014-05-03 ENCOUNTER — Ambulatory Visit (INDEPENDENT_AMBULATORY_CARE_PROVIDER_SITE_OTHER): Payer: Medicaid Other | Admitting: Women's Health

## 2014-05-03 VITALS — BP 98/60 | Ht 61.0 in | Wt 212.5 lb

## 2014-05-03 DIAGNOSIS — Z3202 Encounter for pregnancy test, result negative: Secondary | ICD-10-CM

## 2014-05-03 DIAGNOSIS — Z3009 Encounter for other general counseling and advice on contraception: Secondary | ICD-10-CM

## 2014-05-03 LAB — POCT URINE PREGNANCY: Preg Test, Ur: NEGATIVE

## 2014-05-03 NOTE — Patient Instructions (Signed)
Ethinyl Estradiol; Etonogestrel vaginal ring- Nuva Ring What is this medicine? ETHINYL ESTRADIOL; ETONOGESTREL (ETH in il es tra DYE ole; et oh noe JES trel) vaginal ring is a flexible, vaginal ring used as a contraceptive (birth control method). This medicine combines two types of female hormones, an estrogen and a progestin. This ring is used to prevent ovulation and pregnancy. Each ring is effective for one month. This medicine may be used for other purposes; ask your health care provider or pharmacist if you have questions. COMMON BRAND NAME(S): NuvaRing What should I tell my health care provider before I take this medicine? They need to know if you have or ever had any of these conditions: -abnormal vaginal bleeding -blood vessel disease or blood clots -breast, cervical, endometrial, ovarian, liver, or uterine cancer -diabetes -gallbladder disease -heart disease or recent heart attack -high blood pressure -high cholesterol -kidney disease -liver disease -migraine headaches -stroke -systemic lupus erythematosus (SLE) -tobacco smoker -an unusual or allergic reaction to estrogens, progestins, other medicines, foods, dyes, or preservatives -pregnant or trying to get pregnant -breast-feeding How should I use this medicine? Insert the ring into your vagina as directed. Follow the directions on the prescription label. The ring will remain place for 3 weeks and is then removed for a 1-week break. A new ring is inserted 1 week after the last ring was removed, on the same day of the week. Do not use more often than directed. A patient package insert for the product will be given with each prescription and refill. Read this sheet carefully each time. The sheet may change frequently. Contact your pediatrician regarding the use of this medicine in children. Special care may be needed. This medicine has been used in female children who have started having menstrual periods. Overdosage: If you  think you have taken too much of this medicine contact a poison control center or emergency room at once. NOTE: This medicine is only for you. Do not share this medicine with others. What if I miss a dose? You will need to replace your vaginal ring once a month as directed. If the ring should slip out, or if you leave it in longer or shorter than you should, contact your health care professional for advice. What may interact with this medicine? -acetaminophen -antibiotics or medicines for infections, especially rifampin, rifabutin, rifapentine, and griseofulvin, and possibly penicillins or tetracyclines -aprepitant -ascorbic acid (vitamin C) -atorvastatin -barbiturate medicines, such as phenobarbital -bosentan -carbamazepine -caffeine -clofibrate -cyclosporine -dantrolene -doxercalciferol -felbamate -grapefruit juice -hydrocortisone -medicines for anxiety or sleeping problems, such as diazepam or temazepam -medicines for diabetes, including pioglitazone -modafinil -mycophenolate -nefazodone -oxcarbazepine -phenytoin -prednisolone -ritonavir or other medicines for HIV infection or AIDS -rosuvastatin -selegiline -soy isoflavones supplements -St. John's wort -tamoxifen or raloxifene -theophylline -thyroid hormones -topiramate -warfarin This list may not describe all possible interactions. Give your health care provider a list of all the medicines, herbs, non-prescription drugs, or dietary supplements you use. Also tell them if you smoke, drink alcohol, or use illegal drugs. Some items may interact with your medicine. What should I watch for while using this medicine? Visit your doctor or health care professional for regular checks on your progress. You will need a regular breast and pelvic exam and Pap smear while on this medicine. Use an additional method of contraception during the first cycle that you use this ring. If you have any reason to think you are pregnant, stop  using this medicine right away and contact your doctor or health care   professional. If you are using this medicine for hormone related problems, it may take several cycles of use to see improvement in your condition. Smoking increases the risk of getting a blood clot or having a stroke while you are using hormonal birth control, especially if you are more than 21 years old. You are strongly advised not to smoke. This medicine can make your body retain fluid, making your fingers, hands, or ankles swell. Your blood pressure can go up. Contact your doctor or health care professional if you feel you are retaining fluid. This medicine can make you more sensitive to the sun. Keep out of the sun. If you cannot avoid being in the sun, wear protective clothing and use sunscreen. Do not use sun lamps or tanning beds/booths. If you wear contact lenses and notice visual changes, or if the lenses begin to feel uncomfortable, consult your eye care specialist. In some women, tenderness, swelling, or minor bleeding of the gums may occur. Notify your dentist if this happens. Brushing and flossing your teeth regularly may help limit this. See your dentist regularly and inform your dentist of the medicines you are taking. If you are going to have elective surgery, you may need to stop using this medicine before the surgery. Consult your health care professional for advice. This medicine does not protect you against HIV infection (AIDS) or any other sexually transmitted diseases. What side effects may I notice from receiving this medicine? Side effects that you should report to your doctor or health care professional as soon as possible: -breast tissue changes or discharge -changes in vaginal bleeding during your period or between your periods -chest pain -coughing up blood -dizziness or fainting spells -headaches or migraines -leg, arm or groin pain -severe or sudden headaches -stomach pain (severe) -sudden  shortness of breath -sudden loss of coordination, especially on one side of the body -speech problems -symptoms of vaginal infection like itching, irritation or unusual discharge -tenderness in the upper abdomen -vomiting -weakness or numbness in the arms or legs, especially on one side of the body -yellowing of the eyes or skin Side effects that usually do not require medical attention (report to your doctor or health care professional if they continue or are bothersome): -breakthrough bleeding and spotting that continues beyond the 3 initial cycles of pills -breast tenderness -mood changes, anxiety, depression, frustration, anger, or emotional outbursts -increased sensitivity to sun or ultraviolet light -nausea -skin rash, acne, or brown spots on the skin -weight gain (slight) This list may not describe all possible side effects. Call your doctor for medical advice about side effects. You may report side effects to FDA at 1-800-FDA-1088. Where should I keep my medicine? Keep out of the reach of children. Store at room temperature between 15 and 30 degrees C (59 and 86 degrees F) for up to 4 months. The product will expire after 4 months. Protect from light. Throw away any unused medicine after the expiration date. NOTE: This sheet is a summary. It may not cover all possible information. If you have questions about this medicine, talk to your doctor, pharmacist, or health care provider.  2015, [employer]vier/[employer] Standard. (2008-10-06 12:03:58)  

## 2014-05-03 NOTE — Progress Notes (Signed)
Patient ID: Diane Parks, female   DOB: [birth year], 23 y.o.   MRN: [id number]   [facility]  Patient name: Diane Parks MRN [id number]  Date of birth: [birth year]  CC & HPI:  Diane Parks is a 23 y.o. Caucasian female presenting today wanting to discuss contraception. I saw her 03/07/14 and placed her on nuva ring d/t anovulation and forgetting to take COCs. She states she did have bleeding w/ cramping when she removed after 1st 3wk cycle. Nuva Ring falls out w/ sexual intercourse, and she doesn't like this. So didn't put in another ring when it was time. Discussed that w/ anovulation it is best to have combined contraceptions, so she has tried pills in past and couldn't remember to take, and patch is not as effective in pt's >200lbs. So nuva-ring only other option. Discussed that it is OK to remove just prior to sex and reinsert immediately after to avoid awkwardness of it falling out, can be left out for up to 3hrs- but do not recommend this.  Was unable to come back from TSH & A1C as ordered last time, [facility]c [facility].   Pertinent History Reviewed:  Medical & Surgical Hx:   Past Medical History  Diagnosis Date  . Asthma    Past Surgical History  Procedure Laterality Date  . Eye surgery    . Eye surgery     Medications: Reviewed & Updated - see associated section Social History: Reviewed -  reports that she has never smoked. She has never used smokeless tobacco.  Objective Findings:  Vitals: BP 98/60  Ht 5\' 1"  (1.549 m)  Wt 212 lb 8 oz (96.389 kg)  BMI 40.17 kg/m2  Physical Examination: General appearance - alert, well appearing, and in no distress UPT today: neg  No results found for this or any previous visit (from the past 24 hour(s)).   Assessment & Plan:  A:   Contraception management P:  Begin nuva ring again today, already has rx   F/U May 2016 for pap & physical   Marge DuncansBooker, Kimberly Randall CNM, [facility] 05/03/2014 12:44  PM

## 2014-09-19 ENCOUNTER — Encounter: Payer: Self-pay | Admitting: Women's Health

## 2014-09-19 ENCOUNTER — Ambulatory Visit (INDEPENDENT_AMBULATORY_CARE_PROVIDER_SITE_OTHER): Payer: 59 | Admitting: Women's Health

## 2014-09-19 VITALS — BP 110/78 | Ht 60.0 in | Wt 215.0 lb

## 2014-09-19 DIAGNOSIS — Z30011 Encounter for initial prescription of contraceptive pills: Secondary | ICD-10-CM

## 2014-09-19 DIAGNOSIS — Z3202 Encounter for pregnancy test, result negative: Secondary | ICD-10-CM

## 2014-09-19 DIAGNOSIS — Z308 Encounter for other contraceptive management: Secondary | ICD-10-CM

## 2014-09-19 LAB — POCT URINE PREGNANCY: Preg Test, Ur: NEGATIVE

## 2014-09-19 MED ORDER — NORGESTIM-ETH ESTRAD TRIPHASIC 0.18/0.215/0.25 MG-25 MCG PO TABS: 1.0000 | ORAL_TABLET | Freq: Every day | ORAL | Status: DC

## 2014-09-19 NOTE — Progress Notes (Signed)
Patient ID: Diane Parks, female   DOB: January 10, 1991, 23 y.o.   MRN: [id number]   [facility]  Patient name: Diane Parks MRN [id number]  Date of birth: January 10, 1991  CC & HPI:  Diane Parks is a 23 y.o. Caucasian female presenting today wanting to switch contraception, she is currently on nuva ring b/c she couldn't remember to take COCs daily. She has h/o anovulatory cycles, likely PCOS, so needs estrogen component. Wants to get back on COCs, will set alarm on phone to reminder her to take at same time daily. Does not smoke, no h/o HTN, DVT/PE, CVA, MI, or migraines w/ aura.   Pertinent History Reviewed:  Medical & Surgical Hx:   Past Medical History  Diagnosis Date  . Asthma    Past Surgical History  Procedure Laterality Date  . Eye surgery    . Eye surgery     Medications: Reviewed & Updated - see associated section Social History: Reviewed -  reports that she has never smoked. She has never used smokeless tobacco.  Objective Findings:  Vitals: BP 110/78 mmHg  Ht 5' (1.524 m)  Wt 215 lb (97.523 kg)  BMI 41.99 kg/m2  LMP 04/09/2014  Physical Examination: General appearance - alert, well appearing, and in no distress  Results for orders placed or performed in visit on 09/19/14 (from the past 24 hour(s))  POCT urine pregnancy   Collection Time: 09/19/14  8:51 AM  Result Value Ref Range   Preg Test, Ur Negative      Assessment & Plan:  A:   Contraception management  Probable anovulatory cycles, likely PCOS P:  Rx triphasic COC w/ 11RF, set alarm to remind her to take pills  F/U May for pap & physical  Marge DuncansBooker, Gracyn Allor Randall CNM, [facility] 09/19/2014 9:03 AM

## 2014-09-19 NOTE — Patient Instructions (Signed)
Oral Contraception Use Oral contraceptive pills (OCPs) are medicines taken to prevent pregnancy. OCPs work by preventing the ovaries from releasing eggs. The hormones in OCPs also cause the cervical mucus to thicken, preventing the sperm from entering the uterus. The hormones also cause the uterine lining to become thin, not allowing a fertilized egg to attach to the inside of the uterus. OCPs are highly effective when taken exactly as prescribed. However, OCPs do not prevent sexually transmitted diseases (STDs). Safe sex practices, such as using condoms along with an OCP, can help prevent STDs. Before taking OCPs, you may have a physical exam and Pap test. Your health care provider may also order blood tests if necessary. Your health care provider will make sure you are a good candidate for oral contraception. Discuss with your health care provider the possible side effects of the OCP you may be prescribed. When starting an OCP, it can take 2 to 3 months for the body to adjust to the changes in hormone levels in your body.  HOW TO TAKE ORAL CONTRACEPTIVE PILLS Your health care provider may advise you on how to start taking the first cycle of OCPs. Otherwise, you can:   Start on day 1 of your menstrual period. You will not need any backup contraceptive protection with this start time.   Start on the first Sunday after your menstrual period or the day you get your prescription. In these cases, you will need to use backup contraceptive protection for the first week.   Start the pill at any time of your cycle. If you take the pill within 5 days of the start of your period, you are protected against pregnancy right away. In this case, you will not need a backup form of birth control. If you start at any other time of your menstrual cycle, you will need to use another form of birth control for 7 days. If your OCP is the type called a minipill, it will protect you from pregnancy after taking it for 2 days (48  hours). After you have started taking OCPs:   If you forget to take 1 pill, take it as soon as you remember. Take the next pill at the regular time.   If you miss 2 or more pills, call your health care provider because different pills have different instructions for missed doses. Use backup birth control until your next menstrual period starts.   If you use a 28-day pack that contains inactive pills and you miss 1 of the last 7 pills (pills with no hormones), it will not matter. Throw away the rest of the non-hormone pills and start a new pill pack.  No matter which day you start the OCP, you will always start a new pack on that same day of the week. Have an extra pack of OCPs and a backup contraceptive method available in case you miss some pills or lose your OCP pack.  HOME CARE INSTRUCTIONS   Do not smoke.   Always use a condom to protect against STDs. OCPs do not protect against STDs.   Use a calendar to mark your menstrual period days.   Read the information and directions that came with your OCP. Talk to your health care provider if you have questions.  SEEK MEDICAL CARE IF:   You develop nausea and vomiting.   You have abnormal vaginal discharge or bleeding.   You develop a rash.   You miss your menstrual period.   You are losing   your hair.   You need treatment for mood swings or depression.   You get dizzy when taking the OCP.   You develop acne from taking the OCP.   You become pregnant.  SEEK IMMEDIATE MEDICAL CARE IF:   You develop chest pain.   You develop shortness of breath.   You have an uncontrolled or severe headache.   You develop numbness or slurred speech.   You develop visual problems.   You develop pain, redness, and swelling in the legs.  Document Released: 10/10/2011 Document Revised: 03/07/2014 Document Reviewed: 04/11/2013 Exit[employer] Patient Information 2015 [employer]. This information is not intended to replace  advice given to you by your health care provider. Make sure you discuss any questions you have with your health care provider.  

## 2015-03-15 ENCOUNTER — Other Ambulatory Visit (HOSPITAL_COMMUNITY)
Admission: RE | Admit: 2015-03-15 | Discharge: 2015-03-15 | Disposition: A | Discharge status: Home / Self Care | Payer: 59 | Source: Ambulatory Visit | Attending: Adult Health | Admitting: Adult Health

## 2015-03-15 ENCOUNTER — Ambulatory Visit (INDEPENDENT_AMBULATORY_CARE_PROVIDER_SITE_OTHER): Payer: 59 | Admitting: Adult Health

## 2015-03-15 ENCOUNTER — Encounter: Payer: Self-pay | Admitting: Adult Health

## 2015-03-15 VITALS — BP 120/80 | HR 72 | Ht 61.0 in | Wt 191.5 lb

## 2015-03-15 DIAGNOSIS — Z308 Encounter for other contraceptive management: Secondary | ICD-10-CM

## 2015-03-15 DIAGNOSIS — Z01419 Encounter for gynecological examination (general) (routine) without abnormal findings: Secondary | ICD-10-CM | POA: Insufficient documentation

## 2015-03-15 DIAGNOSIS — Z113 Encounter for screening for infections with a predominantly sexual mode of transmission: Secondary | ICD-10-CM | POA: Insufficient documentation

## 2015-03-15 DIAGNOSIS — L732 Hidradenitis suppurativa: Secondary | ICD-10-CM

## 2015-03-15 MED ORDER — NUVARING 0.12-0.015 MG/24HR VA RING: VAGINAL_RING | VAGINAL | Status: DC

## 2015-03-15 NOTE — Patient Instructions (Signed)
Physical in 1 year Pap in 2 years Use condoms Continue nuva ring

## 2015-03-15 NOTE — Progress Notes (Signed)
Patient ID: Diane Parks, female   DOB: 03-01-1991, 24 y.o.   MRN: [id number] History of Present Illness:  Diane Parks is a 24 year old white female in for well woman gyn exam and pap, she has family planning medicaid.  Current Medications, Allergies, Past Medical History, Past Surgical History, Family History and Social History were reviewed in [facility] electronic [facility] record.     Review of Systems: Patient denies any headaches, hearing loss, fatigue, blurred vision, shortness of breath, chest pain, abdominal pain, problems with bowel movements, urination, or intercourse. No joint pain or mood swings,She is happy with nuva ring, periods are irregular.    Physical Exam:BP 120/80 mmHg  Pulse 72  Ht 5\' 1"  (1.549 m)  Wt 191 lb 8 oz (86.864 kg)  BMI 36.20 kg/m2 General:  Well developed, well nourished, no acute distress Skin:  Warm and dry Neck:  Midline trachea, normal thyroid, good ROM, no lymphadenopathy Lungs; Clear to auscultation bilaterally Breast:  No dominant palpable mass, retraction, or nipple discharge Cardiovascular: Regular rate and rhythm Abdomen:  Soft, non tender, no hepatosplenomegaly Pelvic:  External genitalia is normal in appearance, no lesions, has scaring from hidradenitis.  The vagina is normal in appearance. Urethra has no lesions or masses. The cervix is smooth, pap with GC/CHL performed.  Uterus is felt to be normal size, shape, and contour.  No adnexal masses or tenderness noted.Bladder is non tender, no masses felt. Extremities/musculoskeletal:  No swelling or varicosities noted, no clubbing or cyanosis Psych:  No mood changes, alert and cooperative,seems happy Discussed using bar soap and new razor to shave in peri area.  Impression: Well woman gyn exam with pap, family planning medicaid Contraceptive management Hidradenitis    Plan: Refilled nuva ring x 1 year Physical in 1 year, pap in 2  Check HIV,RPR and HSV2  Use condoms

## 2015-03-16 LAB — RPR: RPR: NONREACTIVE

## 2015-03-16 LAB — HIV ANTIBODY (ROUTINE TESTING W REFLEX): HIV Screen 4th Generation wRfx: NONREACTIVE

## 2015-03-16 LAB — HSV 2 ANTIBODY, IGG

## 2015-03-16 LAB — CYTOLOGY - PAP

## 2015-06-19 ENCOUNTER — Telehealth: Payer: Self-pay | Admitting: Adult Health

## 2015-06-19 NOTE — Telephone Encounter (Signed)
Spoke with pt. Pt is having a brown vaginal discharge with no odor. I advised she would need to be seen. Pt voiced understanding. Call transferred to front desk for appt. JSY

## 2015-06-19 NOTE — Telephone Encounter (Signed)
Left message x 1. JSY 

## 2015-06-20 ENCOUNTER — Ambulatory Visit (INDEPENDENT_AMBULATORY_CARE_PROVIDER_SITE_OTHER): Payer: 59 | Admitting: Adult Health

## 2015-06-20 ENCOUNTER — Encounter: Payer: Self-pay | Admitting: Adult Health

## 2015-06-20 VITALS — BP 100/60 | HR 72 | Ht 61.0 in | Wt 181.0 lb

## 2015-06-20 DIAGNOSIS — N390 Urinary tract infection, site not specified: Secondary | ICD-10-CM

## 2015-06-20 DIAGNOSIS — N898 Other specified noninflammatory disorders of vagina: Secondary | ICD-10-CM | POA: Diagnosis not present

## 2015-06-20 DIAGNOSIS — N760 Acute vaginitis: Secondary | ICD-10-CM | POA: Diagnosis not present

## 2015-06-20 DIAGNOSIS — A499 Bacterial infection, unspecified: Secondary | ICD-10-CM | POA: Diagnosis not present

## 2015-06-20 DIAGNOSIS — Z3202 Encounter for pregnancy test, result negative: Secondary | ICD-10-CM

## 2015-06-20 DIAGNOSIS — B9689 Other specified bacterial agents as the cause of diseases classified elsewhere: Secondary | ICD-10-CM

## 2015-06-20 HISTORY — DX: Other specified bacterial agents as the cause of diseases classified elsewhere: N76.0

## 2015-06-20 HISTORY — DX: Other specified noninflammatory disorders of vagina: N89.8

## 2015-06-20 HISTORY — DX: Urinary tract infection, site not specified: N39.0

## 2015-06-20 HISTORY — DX: Other specified bacterial agents as the cause of diseases classified elsewhere: B96.89

## 2015-06-20 LAB — POCT URINALYSIS DIPSTICK
Glucose, UA: NEGATIVE
LEUKOCYTES UA: NEGATIVE
NITRITE UA: POSITIVE

## 2015-06-20 LAB — POCT WET PREP (WET MOUNT): WBC, Wet Prep HPF POC: POSITIVE

## 2015-06-20 LAB — POCT URINE PREGNANCY: Preg Test, Ur: NEGATIVE

## 2015-06-20 MED ORDER — NITROFURANTOIN MONOHYD MACRO 100 MG PO CAPS: 100.0000 mg | ORAL_CAPSULE | Freq: Two times a day (BID) | ORAL | Status: DC

## 2015-06-20 MED ORDER — METRONIDAZOLE 500 MG PO TABS: 500.0000 mg | ORAL_TABLET | Freq: Two times a day (BID) | ORAL | Status: DC

## 2015-06-20 NOTE — Patient Instructions (Signed)
Urinary Tract Infection Urinary tract infections (UTIs) can develop anywhere along your urinary tract. Your urinary tract is your body's drainage system for removing wastes and extra water. Your urinary tract includes two kidneys, two ureters, a bladder, and a urethra. Your kidneys are a pair of bean-shaped organs. Each kidney is about the size of your fist. They are located below your ribs, one on each side of your spine. CAUSES Infections are caused by microbes, which are microscopic organisms, including fungi, viruses, and bacteria. These organisms are so small that they can only be seen through a microscope. Bacteria are the microbes that most commonly cause UTIs. SYMPTOMS  Symptoms of UTIs may vary by age and gender of the patient and by the location of the infection. Symptoms in young women typically include a frequent and intense urge to urinate and a painful, burning feeling in the bladder or urethra during urination. Older women and men are more likely to be tired, shaky, and weak and have muscle aches and abdominal pain. A fever may mean the infection is in your kidneys. Other symptoms of a kidney infection include pain in your back or sides below the ribs, nausea, and vomiting. DIAGNOSIS To diagnose a UTI, your caregiver will ask you about your symptoms. Your caregiver also will ask to provide a urine sample. The urine sample will be tested for bacteria and white blood cells. White blood cells are made by your body to help fight infection. TREATMENT  Typically, UTIs can be treated with medication. Because most UTIs are caused by a bacterial infection, they usually can be treated with the use of antibiotics. The choice of antibiotic and length of treatment depend on your symptoms and the type of bacteria causing your infection. HOME CARE INSTRUCTIONS  If you were prescribed antibiotics, take them exactly as your caregiver instructs you. Finish the medication even if you feel better after you  have only taken some of the medication.  Drink enough water and fluids to keep your urine clear or pale yellow.  Avoid caffeine, tea, and carbonated beverages. They tend to irritate your bladder.  Empty your bladder often. Avoid holding urine for long periods of time.  Empty your bladder before and after sexual intercourse.  After a bowel movement, women should cleanse from front to back. Use each tissue only once. SEEK MEDICAL CARE IF:   You have back pain.  You develop a fever.  Your symptoms do not begin to resolve within 3 days. SEEK IMMEDIATE MEDICAL CARE IF:   You have severe back pain or lower abdominal pain.  You develop chills.  You have nausea or vomiting.  You have continued burning or discomfort with urination. MAKE SURE YOU:   Understand these instructions.  Will watch your condition.  Will get help right away if you are not doing well or get worse. Document Released: 07/31/2005 Document Revised: 04/21/2012 Document Reviewed: 11/29/2011 [facility] Patient Information 2015 [place], [place]. This information is not intended to replace advice given to you by your health care provider. Make sure you discuss any questions you have with your health care provider. Bacterial Vaginosis Bacterial vaginosis is a vaginal infection that occurs when the normal balance of bacteria in the vagina is disrupted. It results from an overgrowth of certain bacteria. This is the most common vaginal infection in women of childbearing age. Treatment is important to prevent complications, especially in pregnant women, as it can cause a premature delivery. CAUSES  Bacterial vaginosis is caused by an increase  in harmful bacteria that are normally present in smaller amounts in the vagina. Several different kinds of bacteria can cause bacterial vaginosis. However, the reason that the condition develops is not fully understood. RISK FACTORS Certain activities or behaviors can put you at an  increased risk of developing bacterial vaginosis, including:  Having a new sex partner or multiple sex partners.  Douching.  Using an intrauterine device (IUD) for contraception. Women do not get bacterial vaginosis from toilet seats, bedding, swimming pools, or contact with objects around them. SIGNS AND SYMPTOMS  Some women with bacterial vaginosis have no signs or symptoms. Common symptoms include:  Grey vaginal discharge.  A fishlike odor with discharge, especially after sexual intercourse.  Itching or burning of the vagina and vulva.  Burning or pain with urination. DIAGNOSIS  Your health care provider will take a medical history and examine the vagina for signs of bacterial vaginosis. A sample of vaginal fluid may be taken. Your health care provider will look at this sample under a microscope to check for bacteria and abnormal cells. A vaginal pH test may also be done.  TREATMENT  Bacterial vaginosis may be treated with antibiotic medicines. These may be given in the form of a pill or a vaginal cream. A second round of antibiotics may be prescribed if the condition comes back after treatment.  HOME CARE INSTRUCTIONS   Only take over-the-counter or prescription medicines as directed by your health care provider.  If antibiotic medicine was prescribed, take it as directed. Make sure you finish it even if you start to feel better.  Do not have sex until treatment is completed.  Tell all sexual partners that you have a vaginal infection. They should see their health care provider and be treated if they have problems, such as a mild rash or itching.  Practice safe sex by using condoms and only having one sex partner. SEEK MEDICAL CARE IF:   Your symptoms are not improving after 3 days of treatment.  You have increased discharge or pain.  You have a fever. MAKE SURE YOU:   Understand these instructions.  Will watch your condition.  Will get help right away if you are not  doing well or get worse. FOR MORE INFORMATION  Centers for Disease Control and Prevention, Division of STD Prevention: [employer]s.[employer] American Sexual Health Association (ASHA): www.ashastd.org  Document Released: 10/21/2005 Document Revised: 08/11/2013 Document Reviewed: 06/02/2013 [facility] Patient Information 2015 [place], [place]. This information is not intended to replace advice given to you by your health care provider. Make sure you discuss any questions you have with your health care provider. No alcohol with flagyl No sex during treatment Follow up prn Increase water

## 2015-06-20 NOTE — Progress Notes (Signed)
Subjective:     Patient ID: Diane Parks, female   DOB: [birth year], 24 y.o.   MRN: [id number]  HPI [name] is a 24 year old white female in complaining of brown discharge after putting nuva ring in, but she had left it out 2 weeks, instead of 1 week.  Review of Systems Patient denies any headaches, hearing loss, fatigue, blurred vision, shortness of breath, chest pain, abdominal pain, problems with bowel movements, urination, or intercourse. No joint pain or mood swings.See HPI for positives.  Reviewed past medical,surgical, social and family history. Reviewed medications and allergies.     Objective:   Physical Exam BP 100/60 mmHg  Pulse 72  Ht  (1.549 m)  Wt 181 lb (82.101 kg)  BMI 34.22 kg/m2  LMP 06/03/2015 UPT negative, urine dipstick + nitrates, trace protein and blood, Skin warm and dry.Pelvic: external genitalia is normal in appearance no lesions, vagina: white discharge with slight odor,urethra has no lesions or masses noted, cervix is everted at os, uterus: normal size, shape and contour, non tender, no masses felt, adnexa: no masses or tenderness noted. Bladder is non tender and no masses felt. Wet prep: + for clue cells and +WBCs. GC/CHL obtained.     Assessment:     Vaginal discharge BV UTI    Plan:    GC/CHL sent  UA C&S sent Rx flagyl 500 mg 1 bid x 7 days, no alcohol, review handout on BV   Rx macrobid take 1 bid x 7 days #14 no refills, revewi handout on UTI Increase water Instructed again to leave nuva ring in 21-25 days and out 3-7 days but not 2 weeks   No sex during treatment

## 2015-06-21 LAB — URINALYSIS, ROUTINE W REFLEX MICROSCOPIC
Bilirubin, UA: NEGATIVE
Glucose, UA: NEGATIVE
KETONES UA: NEGATIVE
Nitrite, UA: NEGATIVE
PH UA: 6 (ref 5.0–7.5)
SPEC GRAV UA: 1.029 (ref 1.005–1.030)
UUROB: 0.2 mg/dL (ref 0.2–1.0)

## 2015-06-21 LAB — MICROSCOPIC EXAMINATION
Casts: NONE SEEN /lpf
WBC, UA: >30 /hpf — AB (ref 0–?)

## 2015-06-22 LAB — URINE CULTURE

## 2015-06-23 ENCOUNTER — Telehealth: Payer: Self-pay | Admitting: Adult Health

## 2015-06-23 LAB — GC/CHLAMYDIA PROBE AMP
Chlamydia trachomatis, NAA: POSITIVE — AB
Neisseria gonorrhoeae by PCR: NEGATIVE

## 2015-06-23 MED ORDER — AZITHROMYCIN 500 MG PO TABS: ORAL_TABLET | ORAL | Status: DC

## 2015-06-23 NOTE — Telephone Encounter (Signed)
Left message to call about labs 

## 2015-06-23 NOTE — Telephone Encounter (Signed)
Pt aware +Chlamydia, will rx azithromycin 500 mg #2 2 po now, no sex POT 9/15 at 10am, she will tell partner to go to clinic

## 2015-07-20 ENCOUNTER — Ambulatory Visit (INDEPENDENT_AMBULATORY_CARE_PROVIDER_SITE_OTHER): Payer: 59 | Admitting: Adult Health

## 2015-07-20 ENCOUNTER — Encounter: Payer: Self-pay | Admitting: Adult Health

## 2015-07-20 VITALS — BP 108/70 | HR 64 | Ht 61.0 in | Wt 179.0 lb

## 2015-07-20 DIAGNOSIS — Z8619 Personal history of other infectious and parasitic diseases: Secondary | ICD-10-CM | POA: Diagnosis not present

## 2015-07-20 HISTORY — DX: Personal history of other infectious and parasitic diseases: Z86.19

## 2015-07-20 NOTE — Patient Instructions (Signed)
Use condoms Keep nuva ring in as advised

## 2015-07-20 NOTE — Progress Notes (Signed)
Subjective:     Patient ID: Diane Parks, female   DOB: 08/03/1991, 24 y.o.   MRN: [id number]  HPI Deannie is a 24 year old white female in for proof of treatment for recent + chlamydia, she has not had sex since taking her meds,and she told partner to go to clinic, but she is no longer seeing him.No complaints, needs to go get nuva ring refilled is late, on inserting.  Review of Systems Patient denies any headaches, hearing loss, fatigue, blurred vision, shortness of breath, chest pain, abdominal pain, problems with bowel movements, urination, or intercourse. No joint pain or mood swings. Reviewed past medical,surgical, social and family history. Reviewed medications and allergies.     Objective:   Physical Exam BP 108/70 mmHg  Pulse 64  Ht  (1.549 m)  Wt 179 lb (81.194 kg)  BMI 33.84 kg/m2  LMP 07/08/2015  Will send GC/CHL on urine, discussed with her the importance of using condoms and she agrees, had negative, HIV,RPR,and HSV2 in May, declines retesting today.Discussed again on how to use nuva ring.    Assessment:     History of chlamydia, for proof of treatment    Plan:     GC/CHL sent Use condoms Keep nuva ring in as directed Follow up prn

## 2015-07-21 LAB — GC/CHLAMYDIA PROBE AMP
Chlamydia trachomatis, NAA: NEGATIVE
Neisseria gonorrhoeae by PCR: NEGATIVE

## 2015-07-24 ENCOUNTER — Telehealth: Payer: Self-pay | Admitting: Adult Health

## 2015-07-24 NOTE — Telephone Encounter (Signed)
Pt aware GC/CHL negative 

## 2015-07-26 ENCOUNTER — Encounter: Payer: Self-pay | Admitting: Adult Health

## 2015-12-04 ENCOUNTER — Encounter: Payer: Self-pay | Admitting: Adult Health

## 2015-12-06 ENCOUNTER — Encounter: Payer: Self-pay | Admitting: Adult Health

## 2015-12-06 ENCOUNTER — Ambulatory Visit (INDEPENDENT_AMBULATORY_CARE_PROVIDER_SITE_OTHER): Payer: 59 | Admitting: Adult Health

## 2015-12-06 VITALS — BP 108/76 | HR 76 | Ht 61.0 in | Wt 189.0 lb

## 2015-12-06 DIAGNOSIS — N930 Postcoital and contact bleeding: Secondary | ICD-10-CM

## 2015-12-06 DIAGNOSIS — N898 Other specified noninflammatory disorders of vagina: Secondary | ICD-10-CM | POA: Diagnosis not present

## 2015-12-06 DIAGNOSIS — A499 Bacterial infection, unspecified: Secondary | ICD-10-CM

## 2015-12-06 DIAGNOSIS — N926 Irregular menstruation, unspecified: Secondary | ICD-10-CM | POA: Diagnosis not present

## 2015-12-06 DIAGNOSIS — Z3202 Encounter for pregnancy test, result negative: Secondary | ICD-10-CM

## 2015-12-06 DIAGNOSIS — N760 Acute vaginitis: Secondary | ICD-10-CM

## 2015-12-06 DIAGNOSIS — B9689 Other specified bacterial agents as the cause of diseases classified elsewhere: Secondary | ICD-10-CM

## 2015-12-06 HISTORY — DX: Postcoital and contact bleeding: N93.0

## 2015-12-06 HISTORY — DX: Irregular menstruation, unspecified: N92.6

## 2015-12-06 LAB — POCT WET PREP (WET MOUNT)
Clue Cells Wet Prep Whiff POC: POSITIVE
WBC, Wet Prep HPF POC: POSITIVE

## 2015-12-06 LAB — POCT URINE PREGNANCY: PREG TEST UR: NEGATIVE

## 2015-12-06 MED ORDER — METRONIDAZOLE 500 MG PO TABS
500.0000 mg | ORAL_TABLET | Freq: Two times a day (BID) | ORAL | Status: DC
Start: 2015-12-06 — End: 2017-06-05

## 2015-12-06 NOTE — Progress Notes (Signed)
Subjective:     Patient ID: Diane Parks, female   DOB: 12-Oct-1991, 25 y.o.   MRN: [id number]  HPI Diane Parks is a 25 year old white female in complaining of bleeding with sex and has missed a period, had been using nuva ring and somebody threw hers away that were in refrigerator.  Review of Systems Patient denies any headaches, hearing loss, fatigue, blurred vision, shortness of breath, chest pain, abdominal pain, problems with bowel movements, urination. No joint pain or mood swings. See HPI for positives. Reviewed past medical,surgical, social and family history. Reviewed medications and allergies.     Objective:   Physical Exam BP 108/76 mmHg  Pulse 76  Ht  (1.549 m)  Wt 189 lb (85.73 kg)  BMI 35.73 kg/m2  LMP 10/09/2015 UPT negative, Skin warm and dry.Pelvic: external genitalia is normal in appearance no lesions, vagina: white discharge with odor,urethra has no lesions or masses noted, nuva ring in place, cervix:everted at os and bulbous, uterus: normal size, shape and contour, non tender, no masses felt, adnexa: no masses or tenderness noted. Bladder is non tender and no masses felt. Wet prep: + for clue cells and +WBCs. GC/CHL obtained.    Change nuva ring now, and use condoms, 1 ring given.  Face time 15 minutes with pt. Assessment:     PCB  Vaginal discharge BV Missed period    Plan:    Use condoms Rx flagyl 500 mg 1 bid x 7 days, no alcohol, review handout on BV   GC/CHL sent Return in May for physical

## 2015-12-06 NOTE — Patient Instructions (Addendum)
Bacterial Vaginosis Bacterial vaginosis is a vaginal infection that occurs when the normal balance of bacteria in the vagina is disrupted. It results from an overgrowth of certain bacteria. This is the most common vaginal infection in women of childbearing age. Treatment is important to prevent complications, especially in pregnant women, as it can cause a premature delivery. CAUSES  Bacterial vaginosis is caused by an increase in harmful bacteria that are normally present in smaller amounts in the vagina. Several different kinds of bacteria can cause bacterial vaginosis. However, the reason that the condition develops is not fully understood. RISK FACTORS Certain activities or behaviors can put you at an increased risk of developing bacterial vaginosis, including:  Having a new sex partner or multiple sex partners.  Douching.  Using an intrauterine device (IUD) for contraception. Women do not get bacterial vaginosis from toilet seats, bedding, swimming pools, or contact with objects around them. SIGNS AND SYMPTOMS  Some women with bacterial vaginosis have no signs or symptoms. Common symptoms include:  Grey vaginal discharge.  A fishlike odor with discharge, especially after sexual intercourse.  Itching or burning of the vagina and vulva.  Burning or pain with urination. DIAGNOSIS  Your health care provider will take a medical history and examine the vagina for signs of bacterial vaginosis. A sample of vaginal fluid may be taken. Your health care provider will look at this sample under a microscope to check for bacteria and abnormal cells. A vaginal pH test may also be done.  TREATMENT  Bacterial vaginosis may be treated with antibiotic medicines. These may be given in the form of a pill or a vaginal cream. A second round of antibiotics may be prescribed if the condition comes back after treatment. Because bacterial vaginosis increases your risk for sexually transmitted diseases, getting  treated can help reduce your risk for chlamydia, gonorrhea, HIV, and herpes. HOME CARE INSTRUCTIONS   Only take over-the-counter or prescription medicines as directed by your health care provider.  If antibiotic medicine was prescribed, take it as directed. Make sure you finish it even if you start to feel better.  Tell all sexual partners that you have a vaginal infection. They should see their health care provider and be treated if they have problems, such as a mild rash or itching.  During treatment, it is important that you follow these instructions:  Avoid sexual activity or use condoms correctly.  Do not douche.  Avoid alcohol as directed by your health care provider.  Avoid breastfeeding as directed by your health care provider. SEEK MEDICAL CARE IF:   Your symptoms are not improving after 3 days of treatment.  You have increased discharge or pain.  You have a fever. MAKE SURE YOU:   Understand these instructions.  Will watch your condition.  Will get help right away if you are not doing well or get worse. FOR MORE INFORMATION  Centers for Disease Control and Prevention, Division of STD Prevention: [employer] American Sexual Health Association (ASHA): www.ashastd.org    This information is not intended to replace advice given to you by your health care provider. Make sure you discuss any questions you have with your health care provider.   Document Released: 10/21/2005 Document Revised: 11/11/2014 Document Reviewed: 06/02/2013 Elsevier Interactive Patient Education 2016 [employer]. No sex no flagyl Use condoms  Physical in May

## 2015-12-11 ENCOUNTER — Telehealth: Payer: Self-pay | Admitting: Adult Health

## 2015-12-11 LAB — GC/CHLAMYDIA PROBE AMP
Chlamydia trachomatis, NAA: POSITIVE — AB
Neisseria gonorrhoeae by PCR: NEGATIVE

## 2015-12-11 MED ORDER — AZITHROMYCIN 500 MG PO TABS: ORAL_TABLET | ORAL | Status: DC

## 2015-12-11 NOTE — Telephone Encounter (Signed)
Pt aware +CHL,rx azithromycin 500 mg #2 take 2 po now, [facility] sent, POT in 4 weeks, she says she will tell partner to go to clinic

## 2016-01-08 ENCOUNTER — Encounter: Payer: 59 | Admitting: Adult Health

## 2016-01-09 NOTE — Progress Notes (Signed)
This encounter was created in error - please disregard.

## 2016-02-05 ENCOUNTER — Emergency Department (HOSPITAL_COMMUNITY)
Admission: EM | Admit: 2016-02-05 | Discharge: 2016-02-05 | Disposition: A | Discharge status: Home / Self Care | Payer: 59 | Attending: Emergency Medicine | Admitting: Emergency Medicine

## 2016-02-05 ENCOUNTER — Encounter (HOSPITAL_COMMUNITY): Payer: Self-pay | Admitting: Emergency Medicine

## 2016-02-05 DIAGNOSIS — E669 Obesity, unspecified: Secondary | ICD-10-CM | POA: Diagnosis not present

## 2016-02-05 DIAGNOSIS — Z5321 Procedure and treatment not carried out due to patient leaving prior to being seen by health care provider: Secondary | ICD-10-CM | POA: Diagnosis not present

## 2016-02-05 DIAGNOSIS — R51 Headache: Secondary | ICD-10-CM | POA: Insufficient documentation

## 2016-02-05 DIAGNOSIS — J45909 Unspecified asthma, uncomplicated: Secondary | ICD-10-CM | POA: Diagnosis not present

## 2016-02-05 NOTE — ED Notes (Signed)
Called to treatment area. No response. Pt not in WR

## 2016-02-05 NOTE — ED Provider Notes (Signed)
Appears from nursing notes that the patient left without being seen.   Marily MemosJason Patrece Tallie, MD 02/05/16 [phone]

## 2016-02-05 NOTE — ED Notes (Signed)
Pt c/o headaches x 2 weeks. Denies dizziness at present. Speech clear ambulated to triage with steady gait. nad noted.

## 2016-03-07 ENCOUNTER — Other Ambulatory Visit: Payer: 59 | Admitting: Adult Health

## 2016-03-15 ENCOUNTER — Other Ambulatory Visit: Payer: 59 | Admitting: Adult Health

## 2016-03-28 ENCOUNTER — Other Ambulatory Visit: Payer: 59 | Admitting: Adult Health

## 2016-04-11 ENCOUNTER — Other Ambulatory Visit: Payer: 59 | Admitting: Adult Health

## 2016-05-21 ENCOUNTER — Emergency Department (HOSPITAL_COMMUNITY): Payer: 59

## 2016-05-21 ENCOUNTER — Emergency Department (HOSPITAL_COMMUNITY)
Admission: EM | Admit: 2016-05-21 | Discharge: 2016-05-21 | Disposition: A | Discharge status: Home / Self Care | Payer: 59 | Attending: Emergency Medicine | Admitting: Emergency Medicine

## 2016-05-21 ENCOUNTER — Encounter (HOSPITAL_COMMUNITY): Payer: Self-pay | Admitting: Emergency Medicine

## 2016-05-21 DIAGNOSIS — J45909 Unspecified asthma, uncomplicated: Secondary | ICD-10-CM | POA: Diagnosis not present

## 2016-05-21 DIAGNOSIS — R110 Nausea: Secondary | ICD-10-CM | POA: Diagnosis not present

## 2016-05-21 DIAGNOSIS — Z791 Long term (current) use of non-steroidal anti-inflammatories (NSAID): Secondary | ICD-10-CM | POA: Insufficient documentation

## 2016-05-21 DIAGNOSIS — R109 Unspecified abdominal pain: Secondary | ICD-10-CM | POA: Diagnosis present

## 2016-05-21 DIAGNOSIS — Z79899 Other long term (current) drug therapy: Secondary | ICD-10-CM | POA: Insufficient documentation

## 2016-05-21 LAB — BASIC METABOLIC PANEL
ANION GAP: 5 (ref 5–15)
BUN: 8 mg/dL (ref 6–20)
CALCIUM: 8.8 mg/dL — AB (ref 8.9–10.3)
CO2: 28 mmol/L (ref 22–32)
Chloride: 104 mmol/L (ref 101–111)
Creatinine, Ser: 0.63 mg/dL (ref 0.44–1.00)
Glucose, Bld: 96 mg/dL (ref 65–99)
Potassium: 3.7 mmol/L (ref 3.5–5.1)
Sodium: 137 mmol/L (ref 135–145)

## 2016-05-21 LAB — URINE MICROSCOPIC-ADD ON: RBC / HPF: NONE SEEN RBC/hpf (ref 0–5)

## 2016-05-21 LAB — URINALYSIS, ROUTINE W REFLEX MICROSCOPIC
BILIRUBIN URINE: NEGATIVE
Glucose, UA: NEGATIVE mg/dL
Hgb urine dipstick: NEGATIVE
KETONES UR: NEGATIVE mg/dL
NITRITE: NEGATIVE
PROTEIN: NEGATIVE mg/dL
SPECIFIC GRAVITY, URINE: 1.025 (ref 1.005–1.030)
pH: 6 (ref 5.0–8.0)

## 2016-05-21 LAB — CBC WITH DIFFERENTIAL/PLATELET
BASOS ABS: 0 10*3/uL (ref 0.0–0.1)
BASOS PCT: 0 %
EOS ABS: 0.1 10*3/uL (ref 0.0–0.7)
EOS PCT: 1 %
HCT: 39.3 % (ref 36.0–46.0)
Hemoglobin: 12.9 g/dL (ref 12.0–15.0)
Lymphocytes Relative: 26 %
Lymphs Abs: 2.1 10*3/uL (ref 0.7–4.0)
MCH: 27.6 pg (ref 26.0–34.0)
MCHC: 32.8 g/dL (ref 30.0–36.0)
MCV: 84 fL (ref 78.0–100.0)
MONO ABS: 0.9 10*3/uL (ref 0.1–1.0)
Monocytes Relative: 11 %
NEUTROS ABS: 4.9 10*3/uL (ref 1.7–7.7)
Neutrophils Relative %: 62 %
PLATELETS: 323 10*3/uL (ref 150–400)
RBC: 4.68 MIL/uL (ref 3.87–5.11)
RDW: 13.3 % (ref 11.5–15.5)
WBC: 8 10*3/uL (ref 4.0–10.5)

## 2016-05-21 LAB — PREGNANCY, URINE: PREG TEST UR: NEGATIVE

## 2016-05-21 MED ORDER — NAPROXEN 500 MG PO TABS: 500.0000 mg | ORAL_TABLET | Freq: Two times a day (BID) | ORAL | Status: DC

## 2016-05-21 MED ORDER — PROMETHAZINE HCL 25 MG PO TABS: 25.0000 mg | ORAL_TABLET | Freq: Four times a day (QID) | ORAL | Status: DC | PRN

## 2016-05-21 MED ORDER — FENTANYL CITRATE (PF) 100 MCG/2ML IJ SOLN
50.0000 ug | Freq: Once | INTRAMUSCULAR | Status: AC
  Administered 2016-05-21: 50 ug via INTRAVENOUS
  Filled 2016-05-21: qty 2

## 2016-05-21 MED ORDER — ONDANSETRON HCL 4 MG/2ML IJ SOLN
4.0000 mg | Freq: Once | INTRAMUSCULAR | Status: AC
  Administered 2016-05-21: 4 mg via INTRAVENOUS
  Filled 2016-05-21: qty 2

## 2016-05-21 MED ORDER — SODIUM CHLORIDE 0.9 % IV BOLUS (SEPSIS)
1000.0000 mL | Freq: Once | INTRAVENOUS | Status: AC
  Administered 2016-05-21: 1000 mL via INTRAVENOUS

## 2016-05-21 MED ORDER — SODIUM CHLORIDE 0.9 % IV SOLN: INTRAVENOUS | Status: DC

## 2016-05-21 NOTE — ED Provider Notes (Signed)
CSN: [id number]     Arrival date & time 05/21/16  1250 History   First MD Initiated Contact with Patient 05/21/16 1302     Chief Complaint  Patient presents with  . Flank Pain     (Consider location/radiation/quality/duration/timing/severity/associated sxs/prior Treatment) Patient is a 25 y.o. female presenting with flank pain. The history is provided by the patient.  Flank Pain Pertinent negatives include no chest pain, no abdominal pain and no headaches.  Patient with onset of right-sided flank pain yesterday is been constant since it started. Patient states pain is 7 out of 10. Associated urinary frequency but no dysuria. Associated with nausea but no vomiting. No fevers. Patient denies any vaginal discharge. Patient without a history of kidney stones however there a family history of kidney stone.  Past Medical History  Diagnosis Date  . Asthma   . Obesity   . Hidradenitis   . Vaginal discharge 06/20/2015  . BV (bacterial vaginosis) 06/20/2015  . UTI (lower urinary tract infection) 06/20/2015  . Chlamydia   . History of chlamydia 07/20/2015  . PCB (post coital bleeding) 12/06/2015  . Missed period 12/06/2015   Past Surgical History  Procedure Laterality Date  . Eye surgery    . Eye surgery     Family History  Problem Relation Age of Onset  . Cancer Maternal Grandmother   . Cancer Maternal Grandfather   . Cancer Paternal Grandmother   . Cancer Paternal Grandfather   . Hypertension Mother   . Heart disease Mother    Social History  Substance Use Topics  . Smoking status: Never Smoker   . Smokeless tobacco: Never Used  . Alcohol Use: No   OB History    Gravida Para Term Preterm AB TAB SAB Ectopic Multiple Living   0              Review of Systems  Constitutional: Negative for fever.  HENT: Negative for congestion.   Eyes: Negative for visual disturbance.  Cardiovascular: Negative for chest pain.  Gastrointestinal: Positive for nausea. Negative for vomiting and  abdominal pain.  Genitourinary: Positive for flank pain. Negative for dysuria and vaginal discharge.  Musculoskeletal: Negative for back pain.  Skin: Negative for rash.  Neurological: Negative for headaches.  Hematological: Does not bruise/bleed easily.  Psychiatric/Behavioral: Negative for confusion.      Allergies  Review of patient's allergies indicates no known allergies.  Home Medications   Prior to Admission medications   Medication Sig Start Date End Date Taking? Authorizing Provider  ibuprofen (ADVIL,MOTRIN) 200 MG tablet Take 200 mg by mouth every 6 (six) hours as needed.   Yes Historical Provider, MD  azithromycin (ZITHROMAX) 500 MG tablet Take 2 po now Patient not taking: Reported on 05/21/2016 12/11/15   Adline Potter, NP  metroNIDAZOLE (FLAGYL) 500 MG tablet Take 1 tablet (500 mg total) by mouth 2 (two) times daily. Patient not taking: Reported on 05/21/2016 12/06/15   Adline Potter, NP   BP 126/65 mmHg  Pulse 73  Temp(Src) 97.9 F (36.6 C) (Oral)  Resp 18  Ht 5\' 1"  (1.549 m)  Wt 81.194 kg  BMI 33.84 kg/m2  SpO2 95%  LMP 04/27/2016 Physical Exam  Constitutional: She is oriented to person, place, and time. She appears well-developed and well-nourished. No distress.  HENT:  Head: Normocephalic and atraumatic.  Mouth/Throat: Oropharynx is clear and moist.  Eyes: Conjunctivae and EOM are normal. Pupils are equal, round, and reactive to light.  Neck: Normal range of  motion. Neck supple.  Cardiovascular: Normal rate, regular rhythm and normal heart sounds.   No murmur heard. Pulmonary/Chest: Effort normal and breath sounds normal. No respiratory distress.  Abdominal: Soft. Bowel sounds are normal. There is no tenderness.  Musculoskeletal: Normal range of motion.  Neurological: She is alert and oriented to person, place, and time. No cranial nerve deficit. She exhibits normal muscle tone. Coordination normal.  Skin: Skin is warm. No rash noted.  Nursing note  and vitals reviewed.   ED Course  Procedures (including critical care time) Labs Review Labs Reviewed  BASIC METABOLIC PANEL - Abnormal; Notable for the following:    Calcium 8.8 (*)    All other components within normal limits  CBC WITH DIFFERENTIAL/PLATELET  URINALYSIS, ROUTINE W REFLEX MICROSCOPIC (NOT [facility])  PREGNANCY, URINE   Results for orders placed or performed during the hospital encounter of 05/21/16  CBC with Differential/Platelet  Result Value Ref Range   WBC 8.0 4.0 - 10.5 K/uL   RBC 4.68 3.87 - 5.11 MIL/uL   Hemoglobin 12.9 12.0 - 15.0 g/dL   HCT 16.139.3 09.636.0 - 04.546.0 %   MCV 84.0 78.0 - 100.0 fL   MCH 27.6 26.0 - 34.0 pg   MCHC 32.8 30.0 - 36.0 g/dL   RDW 40.913.3 81.111.5 - 91.415.5 %   Platelets 323 150 - 400 K/uL   Neutrophils Relative % 62 %   Neutro Abs 4.9 1.7 - 7.7 K/uL   Lymphocytes Relative 26 %   Lymphs Abs 2.1 0.7 - 4.0 K/uL   Monocytes Relative 11 %   Monocytes Absolute 0.9 0.1 - 1.0 K/uL   Eosinophils Relative 1 %   Eosinophils Absolute 0.1 0.0 - 0.7 K/uL   Basophils Relative 0 %   Basophils Absolute 0.0 0.0 - 0.1 K/uL  Basic metabolic panel  Result Value Ref Range   Sodium 137 135 - 145 mmol/L   Potassium 3.7 3.5 - 5.1 mmol/L   Chloride 104 101 - 111 mmol/L   CO2 28 22 - 32 mmol/L   Glucose, Bld 96 65 - 99 mg/dL   BUN 8 6 - 20 mg/dL   Creatinine, Ser 7.820.63 0.44 - 1.00 mg/dL   Calcium 8.8 (L) 8.9 - 10.3 mg/dL   GFR calc non Af Amer >60 >60 mL/min   GFR calc Af Amer >60 >60 mL/min   Anion gap 5 5 - 15     Imaging Review Ct Renal Stone Study  05/21/2016  CLINICAL DATA:  Right flank pain started today. EXAM: CT ABDOMEN AND PELVIS WITHOUT CONTRAST TECHNIQUE: Multidetector CT imaging of the abdomen and pelvis was performed following the standard protocol without IV contrast. COMPARISON:  None. FINDINGS: Lower chest: The lungs are clear wiithout focal pneumonia, edema, pneumothorax or pleural effusion. Hepatobiliary: No focal abnormality in the liver on  this study without intravenous contrast. No evidence of hepatomegaly. There is no evidence for gallstones, gallbladder wall thickening, or pericholecystic fluid. No intrahepatic or extrahepatic biliary dilation. Pancreas: No focal mass lesion. No dilatation of the main duct. No intraparenchymal cyst. No peripancreatic edema. Spleen: No splenomegaly. No focal mass lesion. Adrenals/Urinary Tract: No adrenal nodule or mass. No mineralized stones are seen in either kidney. No secondary changes in either kidney. No focal abnormality identified in the kidneys on this noncontrast exam. No evidence for hydroureter. The urinary bladder appears normal for the degree of distention. Stomach/Bowel: Stomach is nondistended. No gastric wall thickening. No evidence of outlet obstruction. Duodenum is normally positioned as  is the ligament of Treitz. No small bowel wall thickening. No small bowel dilatation. The terminal ileum is normal. The appendix is not visualized, but there is no edema or inflammation in the region of the cecum. No gross colonic mass. No colonic wall thickening. No substantial diverticular change. Vascular/Lymphatic: No abdominal aortic aneurysm. No abdominal aortic atherosclerotic calcification. There is no gastrohepatic or hepatoduodenal ligament lymphadenopathy. No intraperitoneal or retroperitoneal lymphadenopathy. No pelvic sidewall lymphadenopathy. Reproductive: The uterus has normal CT imaging appearance. There is no adnexal mass. Other: No intraperitoneal free fluid. Musculoskeletal: Bone windows reveal no worrisome lytic or sclerotic osseous lesions. IMPRESSION: 1. No acute findings in the abdomen or pelvis. Specifically, no features to explain the patient's history of right flank pain. Electronically Signed   By: Kennith Center M.D.   On: 05/21/2016 14:30   I have personally reviewed and evaluated these images and lab results as part of my medical decision-making.   EKG Interpretation None       MDM   Final diagnoses:  Right flank pain    Patient with acute onset of right flank pain rated 7 out of 10 started the yesterday is been constant. Associated urinary frequency but no dysuria no hematuria. Associated with nausea but no vomiting. CT scan no findings to explain the flank pain. Patient without a leukocytosis. Electrolytes without any renal function abnormalities. Urinalysis and pregnancy test are still pending. Patient received 1 L of normal saline. For hydration she feels better after the Zofran and the fentanyl.    Vanetta Mulders, MD 05/21/16 1524

## 2016-05-21 NOTE — ED Notes (Signed)
PT states right flank pain and increased urinary frequency that started this am 0830.

## 2016-05-21 NOTE — Discharge Instructions (Signed)
Make a point to follow-up with your doctor. Tickly things don't improve over the next couple days. Take the Naprosyn as needed for pain take the Phenergan as needed for nausea. Work note provided.

## 2016-05-21 NOTE — ED Notes (Signed)
States she is still unable to urinate.

## 2016-06-02 ENCOUNTER — Emergency Department (HOSPITAL_COMMUNITY)
Admission: EM | Admit: 2016-06-02 | Discharge: 2016-06-02 | Disposition: A | Discharge status: Home / Self Care | Payer: 59 | Attending: Emergency Medicine | Admitting: Emergency Medicine

## 2016-06-02 ENCOUNTER — Encounter (HOSPITAL_COMMUNITY): Payer: Self-pay | Admitting: *Deleted

## 2016-06-02 DIAGNOSIS — R109 Unspecified abdominal pain: Secondary | ICD-10-CM | POA: Insufficient documentation

## 2016-06-02 DIAGNOSIS — G43009 Migraine without aura, not intractable, without status migrainosus: Secondary | ICD-10-CM | POA: Diagnosis not present

## 2016-06-02 DIAGNOSIS — Z79899 Other long term (current) drug therapy: Secondary | ICD-10-CM | POA: Diagnosis not present

## 2016-06-02 DIAGNOSIS — Z791 Long term (current) use of non-steroidal anti-inflammatories (NSAID): Secondary | ICD-10-CM | POA: Insufficient documentation

## 2016-06-02 LAB — URINALYSIS, ROUTINE W REFLEX MICROSCOPIC
Bilirubin Urine: NEGATIVE
Glucose, UA: NEGATIVE mg/dL
Ketones, ur: NEGATIVE mg/dL
NITRITE: NEGATIVE
PROTEIN: NEGATIVE mg/dL
Specific Gravity, Urine: >1.03 — ABNORMAL HIGH (ref 1.005–1.030)
pH: 6 (ref 5.0–8.0)

## 2016-06-02 LAB — URINE MICROSCOPIC-ADD ON

## 2016-06-02 LAB — PREGNANCY, URINE: PREG TEST UR: NEGATIVE

## 2016-06-02 MED ORDER — DIPHENHYDRAMINE HCL 50 MG/ML IJ SOLN
25.0000 mg | Freq: Once | INTRAMUSCULAR | Status: AC
  Administered 2016-06-02: 25 mg via INTRAVENOUS
  Filled 2016-06-02: qty 1

## 2016-06-02 MED ORDER — SODIUM CHLORIDE 0.9 % IV BOLUS (SEPSIS)
1000.0000 mL | Freq: Once | INTRAVENOUS | Status: AC
  Administered 2016-06-02: 1000 mL via INTRAVENOUS

## 2016-06-02 MED ORDER — CYCLOBENZAPRINE HCL 5 MG PO TABS: 5.0000 mg | ORAL_TABLET | Freq: Three times a day (TID) | ORAL | 0 refills | Status: DC | PRN

## 2016-06-02 MED ORDER — METOCLOPRAMIDE HCL 5 MG/ML IJ SOLN
10.0000 mg | Freq: Once | INTRAMUSCULAR | Status: AC
  Administered 2016-06-02: 10 mg via INTRAVENOUS
  Filled 2016-06-02: qty 2

## 2016-06-02 MED ORDER — DEXAMETHASONE SODIUM PHOSPHATE 10 MG/ML IJ SOLN
10.0000 mg | Freq: Once | INTRAMUSCULAR | Status: AC
  Administered 2016-06-02: 10 mg via INTRAVENOUS
  Filled 2016-06-02: qty 1

## 2016-06-02 NOTE — ED Provider Notes (Signed)
[facility]Y DEPT Provider Note   CSN: [id number] Arrival date & time: 06/02/16  0220  First Provider Contact:  First MD Initiated Contact with Patient 06/02/16 0250        History   Chief Complaint Chief Complaint  Patient presents with  . Flank Pain    HPI Diane Parks is a 25 y.o. female.  HPI patient reports she started getting a headache 5 days ago. She states it's like a band around the frontal and back part of her head. She describes the pain as sharp and throbbing. She denies nausea, or vomiting. She denies any visual changes. She denies any numbness or tingling to her extremities. She states she gets headaches daily for at least the past year. She has never seen a neurologist. She denies any family history of headaches. She states she took Naprosyn today without relief.  Patient also complains of pain in her right lower back and states it started 1-2 days ago, however she was seen for the same thing on July 18. She does admit this is the same pain.   PCP A Yetta Barre  Past Medical History:  Diagnosis Date  . Asthma   . BV (bacterial vaginosis) 06/20/2015  . Chlamydia   . Hidradenitis   . History of chlamydia 07/20/2015  . Missed period 12/06/2015  . Obesity   . PCB (post coital bleeding) 12/06/2015  . UTI (lower urinary tract infection) 06/20/2015  . Vaginal discharge 06/20/2015    Patient Active Problem List   Diagnosis Date Noted  . PCB (post coital bleeding) 12/06/2015  . Missed period 12/06/2015  . History of chlamydia 07/20/2015  . Vaginal discharge 06/20/2015  . BV (bacterial vaginosis) 06/20/2015  . UTI (lower urinary tract infection) 06/20/2015  . Hidradenitis 03/15/2015  . Obesity 03/07/2014  . Absence of menstruation 02/18/2013    Past Surgical History:  Procedure Laterality Date  . EYE SURGERY    . EYE SURGERY      OB History    Gravida Para Term Preterm AB Living   0             SAB TAB Ectopic Multiple Live Births                    Home Medications    Prior to Admission medications   Medication Sig Start Date End Date Taking? Authorizing Provider  azithromycin (ZITHROMAX) 500 MG tablet Take 2 po now 12/11/15   Adline Potter, NP  cyclobenzaprine (FLEXERIL) 5 MG tablet Take 1 tablet (5 mg total) by mouth 3 (three) times daily as needed (for muscle pain). 06/02/16   Devoria Albe, MD  ibuprofen (ADVIL,MOTRIN) 200 MG tablet Take 200 mg by mouth every 6 (six) hours as needed.    Historical Provider, MD  metroNIDAZOLE (FLAGYL) 500 MG tablet Take 1 tablet (500 mg total) by mouth 2 (two) times daily. 12/06/15   Adline Potter, NP  naproxen (NAPROSYN) 500 MG tablet Take 1 tablet (500 mg total) by mouth 2 (two) times daily. 05/21/16   Vanetta Mulders, MD  promethazine (PHENERGAN) 25 MG tablet Take 1 tablet (25 mg total) by mouth every 6 (six) hours as needed. 05/21/16   Vanetta Mulders, MD    Family History Family History  Problem Relation Age of Onset  . Cancer Maternal Grandmother   . Cancer Maternal Grandfather   . Cancer Paternal Grandmother   . Cancer Paternal Grandfather   . Hypertension Mother   . Heart disease Mother  Social History Social History  Substance Use Topics  . Smoking status: Never Smoker  . Smokeless tobacco: Never Used  . Alcohol use No  employed  Allergies   Review of patient's allergies indicates no known allergies.   Review of Systems Review of Systems  All other systems reviewed and are negative.    Physical Exam Updated Vital Signs BP 127/67   Pulse 64   Temp 97.8 F (36.6 C) (Oral)   Resp 16   Ht 5\' 1"  (1.549 m)   Wt 170 lb (77.1 kg)   LMP 04/27/2016   SpO2 100%   BMI 32.12 kg/m   Vital signs normal    Physical Exam  Constitutional: She is oriented to person, place, and time. She appears well-developed and well-nourished.  Non-toxic appearance. She does not appear ill. No distress.  HENT:  Head: Normocephalic and atraumatic.  Right Ear: External ear  normal.  Left Ear: External ear normal.  Nose: Nose normal. No mucosal edema or rhinorrhea.  Mouth/Throat: Oropharynx is clear and moist and mucous membranes are normal. No dental abscesses or uvula swelling.  Eyes: Conjunctivae and EOM are normal. Pupils are equal, round, and reactive to light.  Neck: Normal range of motion and full passive range of motion without pain. Neck supple.  Cardiovascular: Normal rate, regular rhythm and normal heart sounds.  Exam reveals no gallop and no friction rub.   No murmur heard. Pulmonary/Chest: Effort normal and breath sounds normal. No respiratory distress. She has no wheezes. She has no rhonchi. She has no rales. She exhibits no tenderness and no crepitus.  Abdominal: Soft. Normal appearance and bowel sounds are normal. She exhibits no distension. There is no tenderness. There is no rebound and no guarding.  Musculoskeletal: Normal range of motion. She exhibits no edema or tenderness.       Back:  Moves all extremities well. Patient has tenderness in her lower flank/sacral area. She states it hurts with range of motion and palpation.  Neurological: She is alert and oriented to person, place, and time. She has normal strength. No cranial nerve deficit.  Skin: Skin is warm, dry and intact. No rash noted. No erythema. No pallor.  Psychiatric: She has a normal mood and affect. Her speech is normal and behavior is normal. Her mood appears not anxious.  Nursing note and vitals reviewed.    ED Treatments / Results  Labs (all labs ordered are listed, but only abnormal results are displayed) Results for orders placed or performed during the hospital encounter of 06/02/16  Urinalysis, Routine w reflex microscopic- may I&O cath if menses  Result Value Ref Range   Color, Urine YELLOW YELLOW   APPearance HAZY (A) CLEAR   Specific Gravity, Urine >1.030 (H) 1.005 - 1.030   pH 6.0 5.0 - 8.0   Glucose, UA NEGATIVE NEGATIVE mg/dL   Hgb urine dipstick TRACE (A)  NEGATIVE   Bilirubin Urine NEGATIVE NEGATIVE   Ketones, ur NEGATIVE NEGATIVE mg/dL   Protein, ur NEGATIVE NEGATIVE mg/dL   Nitrite NEGATIVE NEGATIVE   Leukocytes, UA MODERATE (A) NEGATIVE  Pregnancy, urine  Result Value Ref Range   Preg Test, Ur NEGATIVE NEGATIVE  Urine microscopic-add on  Result Value Ref Range   Squamous Epithelial / LPF 6-30 (A) NONE SEEN   WBC, UA 6-30 0 - 5 WBC/hpf   RBC / HPF 0-5 0 - 5 RBC/hpf   Bacteria, UA FEW (A) NONE SEEN   Urine-Other MUCOUS PRESENT    Laboratory interpretation  all normal except concentrated UA, contaminated sample     EKG  EKG Interpretation None       Radiology No results found.   Ct Renal Stone Study  Result Date: 05/21/2016 CLINICAL DATA:  Right flank pain started today.IMPRESSION: 1. No acute findings in the abdomen or pelvis. Specifically, no features to explain the patient's history of right flank pain. Electronically Signed   By: Kennith Center M.D.   On: 05/21/2016 14:30  Procedures Procedures (including critical care time)  Medications Ordered in ED Medications  sodium chloride 0.9 % bolus 1,000 mL (1,000 mLs Intravenous New Bag/Given 06/02/16 0309)  metoCLOPramide (REGLAN) injection 10 mg (10 mg Intravenous Given 06/02/16 0311)  diphenhydrAMINE (BENADRYL) injection 25 mg (25 mg Intravenous Given 06/02/16 0314)  dexamethasone (DECADRON) injection 10 mg (10 mg Intravenous Given 06/02/16 0309)     Initial Impression / Assessment and Plan / ED Course  I have reviewed the triage vital signs and the nursing notes.  Pertinent labs & imaging results that were available during my care of the patient were reviewed by me and considered in my medical decision making (see chart for details).  Clinical Course   Patient was given IV fluids and migraine cocktail for her headache.  Recheck at 4:50 AM he should states her headache is gone. She was discharged home with Flexeril for her side pain. We discussed seeing a neurologist  to see if there is a preventative she could take to help with the frequency of her headaches which now are daily.   Final Clinical Impressions(s) / ED Diagnoses   Final diagnoses:  Migraine without aura and without status migrainosus, not intractable  Right flank pain    New Prescriptions New Prescriptions   CYCLOBENZAPRINE (FLEXERIL) 5 MG TABLET    Take 1 tablet (5 mg total) by mouth 3 (three) times daily as needed (for muscle pain).    Plan discharge  Devoria Albe, MD, Concha Pyo, MD 06/02/16 [phone]

## 2016-06-02 NOTE — Discharge Instructions (Signed)
Use ice packs on the painful areas as needed. Continue the naproxen for your side pain with the Flexeril. You should consider seeing a neurologist to discuss your headaches. There may be a preventative you could take to help control your headaches from happening so often. Look at the recurrent migraine information sheet to see if there are things you're eating or being exposed to that could be triggering your headaches.

## 2016-06-02 NOTE — ED Triage Notes (Signed)
Pt c/o flank pain that started 2-3 days ago and headache that started a week ago, denies any n/v/d,

## 2016-08-17 ENCOUNTER — Emergency Department (HOSPITAL_COMMUNITY)
Admission: EM | Admit: 2016-08-17 | Discharge: 2016-08-17 | Disposition: A | Discharge status: Home / Self Care | Payer: 59 | Attending: Emergency Medicine | Admitting: Emergency Medicine

## 2016-08-17 ENCOUNTER — Encounter (HOSPITAL_COMMUNITY): Payer: Self-pay | Admitting: *Deleted

## 2016-08-17 DIAGNOSIS — Y929 Unspecified place or not applicable: Secondary | ICD-10-CM | POA: Insufficient documentation

## 2016-08-17 DIAGNOSIS — W109XXA Fall (on) (from) unspecified stairs and steps, initial encounter: Secondary | ICD-10-CM | POA: Insufficient documentation

## 2016-08-17 DIAGNOSIS — Y999 Unspecified external cause status: Secondary | ICD-10-CM | POA: Insufficient documentation

## 2016-08-17 DIAGNOSIS — Z79899 Other long term (current) drug therapy: Secondary | ICD-10-CM | POA: Insufficient documentation

## 2016-08-17 DIAGNOSIS — Z792 Long term (current) use of antibiotics: Secondary | ICD-10-CM | POA: Insufficient documentation

## 2016-08-17 DIAGNOSIS — S300XXA Contusion of lower back and pelvis, initial encounter: Secondary | ICD-10-CM

## 2016-08-17 DIAGNOSIS — Z791 Long term (current) use of non-steroidal anti-inflammatories (NSAID): Secondary | ICD-10-CM | POA: Insufficient documentation

## 2016-08-17 DIAGNOSIS — J45909 Unspecified asthma, uncomplicated: Secondary | ICD-10-CM | POA: Insufficient documentation

## 2016-08-17 DIAGNOSIS — Y939 Activity, unspecified: Secondary | ICD-10-CM | POA: Insufficient documentation

## 2016-08-17 MED ORDER — KETOROLAC TROMETHAMINE 10 MG PO TABS
10.0000 mg | ORAL_TABLET | Freq: Once | ORAL | Status: AC
  Administered 2016-08-17: 10 mg via ORAL
  Filled 2016-08-17: qty 1

## 2016-08-17 MED ORDER — DICLOFENAC SODIUM 75 MG PO TBEC: 75.0000 mg | DELAYED_RELEASE_TABLET | Freq: Two times a day (BID) | ORAL | 0 refills | Status: DC

## 2016-08-17 MED ORDER — TRAMADOL HCL 50 MG PO TABS
100.0000 mg | ORAL_TABLET | Freq: Once | ORAL | Status: AC
  Administered 2016-08-17: 100 mg via ORAL
  Filled 2016-08-17: qty 2

## 2016-08-17 MED ORDER — TRAMADOL HCL 50 MG PO TABS: ORAL_TABLET | ORAL | 0 refills | Status: DC

## 2016-08-17 NOTE — ED Notes (Signed)
Pt alert & oriented x4, stable gait. Patient given discharge instructions, paperwork & prescription(s). Patient  instructed to stop at the registration desk to finish any additional paperwork. Patient verbalized understanding. Pt left department w/ no further questions. 

## 2016-08-17 NOTE — Discharge Instructions (Signed)
Your examination suggest a deep bruise to the right buttocks. Please use an ice pack tonight and tomorrow night. After that you may use heat or ice according to your comfort. I would strongly suggest getting a donut pillow, or a soft regular pillow to sit on until this issue has resolved. This issue may take 7-10 days to resolve. Please use diclofenac 2 times daily. May use Tylenol Extra Strength in between the diclofenac doses for pain if needed. May use Ultram at bedtime if the pain is severe. Please see Diane Parks for additional evaluation and management if not improving.

## 2016-08-17 NOTE — ED Notes (Signed)
Pt states she fell down 4 steps 3 or 4 days ago, complaining of right buttocks pain. Pt says she has a bad bruise to that side.

## 2016-08-17 NOTE — ED Provider Notes (Signed)
[facility] Provider Note   CSN: [id number] Arrival date & time: 08/17/16  1946  By signing my name below, I, Christy Sartorius, attest that this documentation has been prepared under the direction and in the presence of  Ivery Quale, PA-C. Electronically Signed: Christy Sartorius, ED Scribe. 08/17/16. 9:20 PM.  History   Chief Complaint Chief Complaint  Patient presents with  . Fall   The history is provided by the patient and medical records. No language interpreter was used.     HPI Comments:  Diane Parks is a 25 y.o. female who presents to the Emergency Department s/p fall 2 nights ago complaining of gradually worsening pain in her right buttocks.  Pt reports she fell down 4 steps and landed on buttocks buttocks.  Today she noted bruising to the site and an increase in pain.  Pt denies of blood thinners, history of bleeding disorders and operations involving her back or her buttocks.  No alleviating factors noted.  She denies known allergies, chance of pregnancy and breast feeding.    Past Medical History:  Diagnosis Date  . Asthma   . BV (bacterial vaginosis) 06/20/2015  . Chlamydia   . Hidradenitis   . History of chlamydia 07/20/2015  . Missed period 12/06/2015  . Obesity   . PCB (post coital bleeding) 12/06/2015  . UTI (lower urinary tract infection) 06/20/2015  . Vaginal discharge 06/20/2015    Patient Active Problem List   Diagnosis Date Noted  . PCB (post coital bleeding) 12/06/2015  . Missed period 12/06/2015  . History of chlamydia 07/20/2015  . Vaginal discharge 06/20/2015  . BV (bacterial vaginosis) 06/20/2015  . UTI (lower urinary tract infection) 06/20/2015  . Hidradenitis 03/15/2015  . Obesity 03/07/2014  . Absence of menstruation 02/18/2013    Past Surgical History:  Procedure Laterality Date  . EYE SURGERY    . EYE SURGERY      OB History    Gravida Para Term Preterm AB Living   0             SAB TAB Ectopic Multiple Live Births                  Home Medications    Prior to Admission medications   Medication Sig Start Date End Date Taking? Authorizing Provider  azithromycin (ZITHROMAX) 500 MG tablet Take 2 po now 12/11/15   Adline Potter, NP  cyclobenzaprine (FLEXERIL) 5 MG tablet Take 1 tablet (5 mg total) by mouth 3 (three) times daily as needed (for muscle pain). 06/02/16   Devoria Albe, MD  ibuprofen (ADVIL,MOTRIN) 200 MG tablet Take 200 mg by mouth every 6 (six) hours as needed.    Historical Provider, MD  metroNIDAZOLE (FLAGYL) 500 MG tablet Take 1 tablet (500 mg total) by mouth 2 (two) times daily. 12/06/15   Adline Potter, NP  naproxen (NAPROSYN) 500 MG tablet Take 1 tablet (500 mg total) by mouth 2 (two) times daily. 05/21/16   Vanetta Mulders, MD  promethazine (PHENERGAN) 25 MG tablet Take 1 tablet (25 mg total) by mouth every 6 (six) hours as needed. 05/21/16   Vanetta Mulders, MD    Family History Family History  Problem Relation Age of Onset  . Cancer Maternal Grandmother   . Cancer Maternal Grandfather   . Cancer Paternal Grandmother   . Cancer Paternal Grandfather   . Hypertension Mother   . Heart disease Mother     Social History Social History  Substance Use Topics  .  Smoking status: Never Smoker  . Smokeless tobacco: Never Used  . Alcohol use No     Allergies   Review of patient's allergies indicates no known allergies.   Review of Systems Review of Systems  Musculoskeletal: Positive for myalgias.  Neurological: Negative for weakness.  All other systems reviewed and are negative.    Physical Exam Updated Vital Signs BP 121/60 (BP Location: Left Arm)   Pulse 73   Temp 98.5 F (36.9 C) (Oral)   Resp 18   Ht 5\' 1"  (1.549 m)   Wt 180 lb (81.6 kg)   LMP 08/15/2016   SpO2 99%   BMI 34.01 kg/m   Physical Exam  Constitutional: She is oriented to person, place, and time. She appears well-developed and well-nourished. No distress.  HENT:  Head: Normocephalic and  atraumatic.  Eyes: Conjunctivae are normal.  Cardiovascular: Normal rate.   Pulmonary/Chest: Effort normal.  Genitourinary:  Genitourinary Comments: Bruising and swelling of the right buttocks cheek.  No bleeding from the buttocks cleft.  Hematoma does not involve the anus.  Tenderness to palpation of the hematoma area.    Neurological: She is alert and oriented to person, place, and time.  No gross motor or sensory deficit involving the lower extremities.    Skin: Skin is warm and dry.  Psychiatric: She has a normal mood and affect.  Nursing note and vitals reviewed.    ED Treatments / Results   DIAGNOSTIC STUDIES:  Oxygen Saturation is 99% on RA, NML by my interpretation.    COORDINATION OF CARE:  9:20 PM Diclofenac 2 x daily and tylenol in between diclofenac doses if needed.   Instructed pt to apply ice tonight and tomorrow and alternate heat and ice as she sees fit after that.  Pt recommended to use a donut pillow.  Pt informed this may take a week to improve.  Discussed treatment plan with pt at bedside and pt agreed to plan.  Labs (all labs ordered are listed, but only abnormal results are displayed) Labs Reviewed - No data to display  EKG  EKG Interpretation None       Radiology No results found.  Procedures Procedures (including critical care time)  Medications Ordered in ED Medications - No data to display   Initial Impression / Assessment and Plan / ED Course  I have reviewed the triage vital signs and the nursing notes.  Pertinent labs & imaging results that were available during my care of the patient were reviewed by me and considered in my medical decision making (see chart for details).  Clinical Course    **I have reviewed nursing notes, vital signs, and all appropriate lab and imaging results for this patient.*  Final Clinical Impressions(s) / ED Diagnoses   Final diagnoses:  Contusion of buttock, initial encounter    New  Prescriptions New Prescriptions   No medications on file   **I personally performed the services described in this documentation, which was scribed in my presence. The recorded information has been reviewed and is accurate.Ivery Quale*   Akemi Overholser, PA-C 08/19/16 2030    Samuel JesterKathleen McManus, DO 08/20/16 1958

## 2016-08-17 NOTE — ED Triage Notes (Signed)
Pt fell and landed on her bottom about 2 days ago. C/o bottom with movement and sitting.  Denies hitting her head with fall.

## 2017-06-05 ENCOUNTER — Other Ambulatory Visit (HOSPITAL_COMMUNITY)
Admission: RE | Admit: 2017-06-05 | Discharge: 2017-06-05 | Disposition: A | Discharge status: Home / Self Care | Payer: Self-pay | Source: Ambulatory Visit | Attending: Adult Health | Admitting: Adult Health

## 2017-06-05 ENCOUNTER — Encounter: Payer: Self-pay | Admitting: Adult Health

## 2017-06-05 ENCOUNTER — Ambulatory Visit (INDEPENDENT_AMBULATORY_CARE_PROVIDER_SITE_OTHER): Payer: Medicaid Other | Admitting: Adult Health

## 2017-06-05 VITALS — BP 112/60 | HR 68 | Ht 61.0 in | Wt 194.0 lb

## 2017-06-05 DIAGNOSIS — Z30013 Encounter for initial prescription of injectable contraceptive: Secondary | ICD-10-CM

## 2017-06-05 DIAGNOSIS — Z3009 Encounter for other general counseling and advice on contraception: Secondary | ICD-10-CM

## 2017-06-05 DIAGNOSIS — Z01419 Encounter for gynecological examination (general) (routine) without abnormal findings: Secondary | ICD-10-CM | POA: Insufficient documentation

## 2017-06-05 DIAGNOSIS — N926 Irregular menstruation, unspecified: Secondary | ICD-10-CM

## 2017-06-05 DIAGNOSIS — Z309 Encounter for contraceptive management, unspecified: Secondary | ICD-10-CM | POA: Diagnosis not present

## 2017-06-05 MED ORDER — MEDROXYPROGESTERONE ACETATE 150 MG/ML IM SUSP: 150.0000 mg | INTRAMUSCULAR | 4 refills | Status: DC

## 2017-06-05 NOTE — Progress Notes (Signed)
Patient ID: Diane Parks, female   DOB: 1990/12/18, 26 y.o.   MRN: [id number] History of Present Illness: Diane Parks is a 26 year old white female in for well woman gyn exam and pap.She has missed 2 periods and had negative HPT and ?+HPT, she stopped nuva ring in May with last period.Last sex 05/29/17.    Current Medications, Allergies, Past Medical History, Past Surgical History, Family History and Social History were reviewed in [facility] electronic medical record.     Review of Systems:  Patient denies any headaches, hearing loss, fatigue, blurred vision, shortness of breath, chest pain, abdominal pain, problems with bowel movements, urination, or intercourse. No joint pain or mood swings.Has missed 2 periods.   Physical Exam:BP 112/60 (BP Location: Right Arm, Patient Position: Sitting, Cuff Size: Normal)   Pulse 68   Ht 5\' 1"  (1.549 m)   Wt 194 lb (88 kg)   LMP 03/31/2017 (Exact Date)   BMI 36.66 kg/m UPT negative.  General:  Well developed, well nourished, no acute distress Skin:  Warm and dry, +caries  Neck:  Midline trachea, normal thyroid, good ROM, no lymphadenopathy Lungs; Clear to auscultation bilaterally Breast:  No dominant palpable mass, retraction, or nipple discharge Cardiovascular: Regular rate and rhythm Abdomen:  Soft, non tender, no hepatosplenomegaly Pelvic:  External genitalia is normal in appearance, no lesions.  The vagina is normal in appearance. Urethra has no lesions or masses. The cervix is smooth.Pap with GC/CHL performed.  Uterus is felt to be normal size, shape, and contour, slightly tender.  No adnexal masses or tenderness noted.Bladder is non tender, no masses felt. Extremities/musculoskeletal:  No swelling or varicosities noted, no clubbing or cyanosis Psych:  No mood changes, alert and cooperative,seems happy PHQ 2 score 0. Since UPT negative wants to get on depo.  Impression:  1. Encounter for gynecological examination with Papanicolaou smear  of cervix   2. Family planning   3. Missed period      Plan: Meds ordered this encounter  Medications  . medroxyPROGESTERone (DEPO-PROVERA) 150 MG/ML injection    Sig: Inject 1 mL (150 mg total) into the muscle every 3 (three) months.    Dispense:  1 mL    Refill:  4    Order Specific Question:   Supervising Provider    Answer:   Duane LopeEURE, LUTHER H [[id number]]  [facility] in [facility] week [facility] and dep[facility] in pm No sex Check HIV and RPR Physical in 1 year, pap in 3 if normal

## 2017-06-06 LAB — HIV ANTIBODY (ROUTINE TESTING W REFLEX): HIV Screen 4th Generation wRfx: NONREACTIVE

## 2017-06-06 LAB — RPR: RPR Ser Ql: NONREACTIVE

## 2017-06-09 LAB — CYTOLOGY - PAP
Chlamydia: NEGATIVE
Diagnosis: NEGATIVE
NEISSERIA GONORRHEA: NEGATIVE

## 2017-06-12 ENCOUNTER — Other Ambulatory Visit: Payer: Medicaid Other

## 2017-06-12 ENCOUNTER — Ambulatory Visit: Payer: Medicaid Other

## 2017-06-26 ENCOUNTER — Other Ambulatory Visit: Payer: Medicaid Other

## 2017-06-26 ENCOUNTER — Ambulatory Visit: Payer: Medicaid Other

## 2017-06-26 DIAGNOSIS — Z3042 Encounter for surveillance of injectable contraceptive: Secondary | ICD-10-CM

## 2017-08-25 ENCOUNTER — Encounter (HOSPITAL_COMMUNITY): Payer: Self-pay | Admitting: Emergency Medicine

## 2017-08-25 ENCOUNTER — Emergency Department (HOSPITAL_COMMUNITY)
Admission: EM | Admit: 2017-08-25 | Discharge: 2017-08-25 | Disposition: A | Discharge status: Home / Self Care | Payer: Self-pay | Attending: Emergency Medicine | Admitting: Emergency Medicine

## 2017-08-25 DIAGNOSIS — J069 Acute upper respiratory infection, unspecified: Secondary | ICD-10-CM | POA: Insufficient documentation

## 2017-08-25 DIAGNOSIS — Z79899 Other long term (current) drug therapy: Secondary | ICD-10-CM | POA: Insufficient documentation

## 2017-08-25 DIAGNOSIS — J029 Acute pharyngitis, unspecified: Secondary | ICD-10-CM | POA: Insufficient documentation

## 2017-08-25 DIAGNOSIS — J45909 Unspecified asthma, uncomplicated: Secondary | ICD-10-CM | POA: Insufficient documentation

## 2017-08-25 LAB — RAPID STREP SCREEN (MED CTR MEBANE ONLY): STREPTOCOCCUS, GROUP A SCREEN (DIRECT): NEGATIVE

## 2017-08-25 NOTE — Discharge Instructions (Signed)
Return if any problems.

## 2017-08-25 NOTE — ED Provider Notes (Signed)
[facility]ENCY [facility]PART[facility] Provider Note   CSN: [id number] Arrival date & time: 08/25/17  1929     History   Chief Complaint Chief Complaint  Patient presents with  . Sore Throat    HPI Diane Parks is a 26 y.o. female.  The history is provided by the patient.  Sore Throat  This is a new problem. The current episode started yesterday. The problem occurs constantly. Pertinent negatives include no chest pain. Nothing aggravates the symptoms. Nothing relieves the symptoms. She has tried nothing for the symptoms. The treatment provided no relief.  Pt complains of a sore throat and and nasal drainage. Past Medical History:  Diagnosis Date  . Asthma   . BV (bacterial vaginosis) 06/20/2015  . Chlamydia   . Hidradenitis   . History of chlamydia 07/20/2015  . Missed period 12/06/2015  . Obesity   . PCB (post coital bleeding) 12/06/2015  . UTI (lower urinary tract infection) 06/20/2015  . Vaginal discharge 06/20/2015    Patient Active Problem List   Diagnosis Date Noted  . Encounter for initial prescription of injectable contraceptive 06/05/2017  . Encounter for gynecological examination with Papanicolaou smear of cervix 06/05/2017  . PCB (post coital bleeding) 12/06/2015  . Missed period 12/06/2015  . History of chlamydia 07/20/2015  . Vaginal discharge 06/20/2015  . BV (bacterial vaginosis) 06/20/2015  . UTI (lower urinary tract infection) 06/20/2015  . Hidradenitis 03/15/2015  . Obesity 03/07/2014  . Absence of menstruation 02/18/2013    Past Surgical History:  Procedure Laterality Date  . EYE SURGERY    . EYE SURGERY      OB History    Gravida Para Term Preterm AB Living   0             SAB TAB Ectopic Multiple Live Births                   Home Medications    Prior to Admission medications   Medication Sig Start Date End Date Taking? Authorizing Provider  medroxyPROGESTERone (DEPO-PROVERA) 150 MG/ML injection Inject 1 mL (150 mg total) into the  muscle every 3 (three) months. 06/05/17   Adline PotterGriffin, Jennifer A, NP    Family History Family History  Problem Relation Age of Onset  . Cancer Maternal Grandmother   . Cancer Maternal Grandfather   . Cancer Paternal Grandmother   . Cancer Paternal Grandfather   . Hypertension Mother   . Heart disease Mother     Social History Social History  Substance Use Topics  . Smoking status: Never Smoker  . Smokeless tobacco: Never Used  . Alcohol use No     Allergies   Patient has no known allergies.   Review of Systems Review of Systems  Cardiovascular: Negative for chest pain.  All other systems reviewed and are negative.    Physical Exam Updated Vital Signs BP 119/76 (BP Location: Right Arm)   Pulse 82   Temp 98.7 F (37.1 C) (Oral)   Resp 18   Ht 5\' 1"  (1.549 m)   Wt 77.1 kg (170 lb)   SpO2 99%   BMI 32.12 kg/m   Physical Exam  Constitutional: She appears well-developed and well-nourished. No distress.  HENT:  Head: Normocephalic and atraumatic.  Right Ear: External ear normal.  Left Ear: External ear normal.  Erythema pharynx,  Nasal drainage  Eyes: Conjunctivae are normal.  Neck: Neck supple.  Cardiovascular: Normal rate and regular rhythm.   No murmur heard. Pulmonary/Chest: Effort  normal and breath sounds normal. No respiratory distress.  Abdominal: Soft. There is no tenderness.  Musculoskeletal: She exhibits no edema.  Neurological: She is alert.  Skin: Skin is warm and dry.  Psychiatric: She has a normal mood and affect.  Nursing note and vitals reviewed.    ED Treatments / Results  Labs (all labs ordered are listed, but only abnormal results are displayed) Labs Reviewed  RAPID STREP SCREEN (NOT AT [facility])  CULTURE, GROUP A STREP [facility])  Strep negative  EKG  EKG Interpretation None       Radiology No results found.  Procedures Procedures (including critical care time)  Medications Ordered in ED Medications - No data to  display   Initial Impression / Assessment and Plan / ED Course  I have reviewed the triage vital signs and the nursing notes.  Pertinent labs & imaging results that were available during my care of the patient were reviewed by me and considered in my medical decision making (see chart for details).       Final Clinical Impressions(s) / ED Diagnoses   Final diagnoses:  Pharyngitis, unspecified etiology  Upper respiratory tract infection, unspecified type    New Prescriptions New Prescriptions   No medications on file  An After Visit Summary was printed and given to the patient.    Osie Cheeks 08/25/17 2215    Vanetta Mulders, MD 08/28/17 [phone]

## 2017-08-25 NOTE — ED Triage Notes (Signed)
Pt c/o sore throat, dizziness and states it feels hard to breathe at times.

## 2017-08-28 LAB — CULTURE, GROUP A STREP (THRC)

## 2017-10-07 ENCOUNTER — Encounter (HOSPITAL_COMMUNITY): Payer: Self-pay | Admitting: *Deleted

## 2017-10-07 ENCOUNTER — Other Ambulatory Visit: Payer: Self-pay

## 2017-10-07 ENCOUNTER — Emergency Department (HOSPITAL_COMMUNITY)
Admission: EM | Admit: 2017-10-07 | Discharge: 2017-10-07 | Disposition: A | Discharge status: Home / Self Care | Payer: Self-pay | Attending: Emergency Medicine | Admitting: Emergency Medicine

## 2017-10-07 DIAGNOSIS — J45909 Unspecified asthma, uncomplicated: Secondary | ICD-10-CM | POA: Insufficient documentation

## 2017-10-07 DIAGNOSIS — R42 Dizziness and giddiness: Secondary | ICD-10-CM | POA: Insufficient documentation

## 2017-10-07 DIAGNOSIS — R0981 Nasal congestion: Secondary | ICD-10-CM

## 2017-10-07 DIAGNOSIS — J069 Acute upper respiratory infection, unspecified: Secondary | ICD-10-CM | POA: Insufficient documentation

## 2017-10-07 DIAGNOSIS — Z79899 Other long term (current) drug therapy: Secondary | ICD-10-CM | POA: Insufficient documentation

## 2017-10-07 MED ORDER — PSEUDOEPHEDRINE HCL 60 MG PO TABS
60.0000 mg | ORAL_TABLET | Freq: Once | ORAL | Status: AC
  Administered 2017-10-07: 60 mg via ORAL
  Filled 2017-10-07: qty 1

## 2017-10-07 MED ORDER — IBUPROFEN 800 MG PO TABS
800.0000 mg | ORAL_TABLET | Freq: Once | ORAL | Status: AC
  Administered 2017-10-07: 800 mg via ORAL
  Filled 2017-10-07: qty 1

## 2017-10-07 NOTE — ED Triage Notes (Signed)
Nasal congestion onset tonight, needs note for work

## 2017-10-07 NOTE — ED Provider Notes (Signed)
[facility] EMERGENCY DEPARTMENT Provider Note   CSN: [id number] Arrival date & time: 10/07/17  1833     History   Chief Complaint Chief Complaint  Patient presents with  . Nasal Congestion    HPI Diane Parks is a 26 y.o. female.  Patient is a 26 year old female who presents to the emergency department with a complaint of congestion and dizziness.  The patient states that this problem started on yesterday with nasal congestion, followed by dizziness that she describes as feeling as though everything was spinning.  She came home and went to bed.  She states however when she woke up this morning to go to work that everything was still spinning, she had aching, and she had congestion.  She has been resting in bed most of the day and the symptoms do not seem to be getting any better.  Patient presents to the emergency department for additional evaluation.  She denies nausea, vomiting, diarrhea.  There is been no unusual rash.  No hot joints reported.  The patient states she feels as though she had fever because she had aching and she felt hot, but did not measure a temperature at home.  States she has had a lot of congestion in her nasal passages, but has not seen any blood streaked mucus at all.   The history is provided by the patient.    Past Medical History:  Diagnosis Date  . Asthma   . BV (bacterial vaginosis) 06/20/2015  . Chlamydia   . Hidradenitis   . History of chlamydia 07/20/2015  . Missed period 12/06/2015  . Obesity   . PCB (post coital bleeding) 12/06/2015  . UTI (lower urinary tract infection) 06/20/2015  . Vaginal discharge 06/20/2015    Patient Active Problem List   Diagnosis Date Noted  . Encounter for initial prescription of injectable contraceptive 06/05/2017  . Encounter for gynecological examination with Papanicolaou smear of cervix 06/05/2017  . PCB (post coital bleeding) 12/06/2015  . Missed period 12/06/2015  . History of chlamydia 07/20/2015  .  Vaginal discharge 06/20/2015  . BV (bacterial vaginosis) 06/20/2015  . UTI (lower urinary tract infection) 06/20/2015  . Hidradenitis 03/15/2015  . Obesity 03/07/2014  . Absence of menstruation 02/18/2013    Past Surgical History:  Procedure Laterality Date  . EYE SURGERY    . EYE SURGERY      OB History    Gravida Para Term Preterm AB Living   0             SAB TAB Ectopic Multiple Live Births                   Home Medications    Prior to Admission medications   Medication Sig Start Date End Date Taking? Authorizing Provider  medroxyPROGESTERone (DEPO-PROVERA) 150 MG/ML injection Inject 1 mL (150 mg total) into the muscle every 3 (three) months. 06/05/17   Adline PotterGriffin, Jennifer A, NP    Family History Family History  Problem Relation Age of Onset  . Cancer Maternal Grandmother   . Cancer Maternal Grandfather   . Cancer Paternal Grandmother   . Cancer Paternal Grandfather   . Hypertension Mother   . Heart disease Mother     Social History Social History   Tobacco Use  . Smoking status: Never Smoker  . Smokeless tobacco: Never Used  Substance Use Topics  . Alcohol use: No  . Drug use: No     Allergies   Patient has no known  allergies.   Review of Systems Review of Systems  Constitutional: Positive for activity change, appetite change, chills and fever.       All ROS Neg except as noted in HPI  HENT: Positive for congestion, postnasal drip and sinus pressure. Negative for nosebleeds.   Eyes: Negative for photophobia and discharge.  Respiratory: Negative for cough, shortness of breath and wheezing.   Cardiovascular: Negative for chest pain and palpitations.  Gastrointestinal: Negative for abdominal pain and blood in stool.  Genitourinary: Negative for dysuria, frequency and hematuria.  Musculoskeletal: Negative for arthralgias, back pain and neck pain.  Skin: Negative.  Negative for rash.  Neurological: Positive for dizziness. Negative for seizures and  speech difficulty.  Psychiatric/Behavioral: Negative for confusion and hallucinations.     Physical Exam Updated Vital Signs BP 120/71 (BP Location: Left Arm)   Pulse (!) 101   Temp 98.5 F (36.9 C) (Oral)   Resp 18   Ht 5\' 1"  (1.549 m)   Wt 80.7 kg (178 lb)   LMP 09/04/2017   SpO2 99%   BMI 33.63 kg/m   Physical Exam  Constitutional: She is oriented to person, place, and time. She appears well-developed and well-nourished.  Non-toxic appearance.  HENT:  Head: Normocephalic.  Right Ear: Tympanic membrane and external ear normal.  Left Ear: Tympanic membrane and external ear normal.  Eyes: EOM and lids are normal. Pupils are equal, round, and reactive to light.  Neck: Normal range of motion. Neck supple. Carotid bruit is not present.  Cardiovascular: Normal rate, regular rhythm, normal heart sounds, intact distal pulses and normal pulses.  Pulmonary/Chest: Breath sounds normal. No respiratory distress.  Abdominal: Soft. Bowel sounds are normal. There is no tenderness. There is no guarding.  Musculoskeletal: Normal range of motion.  Lymphadenopathy:       Head (right side): No submandibular adenopathy present.       Head (left side): No submandibular adenopathy present.    She has no cervical adenopathy.  Neurological: She is alert and oriented to person, place, and time. She has normal strength. No cranial nerve deficit or sensory deficit. Coordination normal.  Gait is steady.  There are no gross neurologic deficits appreciated whatsoever.  Skin: Skin is warm and dry.  Psychiatric: She has a normal mood and affect. Her speech is normal.  Nursing note and vitals reviewed.    ED Treatments / Results  Labs (all labs ordered are listed, but only abnormal results are displayed) Labs Reviewed - No data to display  EKG  EKG Interpretation None       Radiology No results found.  Procedures Procedures (including critical care time)  Medications Ordered in  ED Medications - No data to display   Initial Impression / Assessment and Plan / ED Course  I have reviewed the triage vital signs and the nursing notes.  Pertinent labs & imaging results that were available during my care of the patient were reviewed by me and considered in my medical decision making (see chart for details).       Final Clinical Impressions(s) / ED Diagnoses MDM Vital signs are within normal limits.  Pulse oximetry is 99% on room air.  Within normal limits by my interpretation.  The examination favors upper respiratory infection with sinusitis.  No unusual rash, hemoptysis, diarrhea, vomiting, or other acute changes.  Patient is provided with a mass.  Asked to increase fluids.  She will use a decongesting medication, Tylenol every 4 hours, or ibuprofen every 6  hours.  I have asked the patient to wash hands frequently and to increase fluids.  The patient is to follow-up with Dr. Yetta BarreJones, or return to the emergency department if any emergent changes, problems, or concerns.   Final diagnoses:  Upper respiratory tract infection, unspecified type  Nasal congestion    ED Discharge Orders    None       Ivery QualeBryant, Hodari Chuba, PA-C 10/07/17 1946    Doug SouJacubowitz, Sam, MD 10/08/17 [phone]

## 2017-10-07 NOTE — Discharge Instructions (Signed)
Please use a mask until symptoms have resolved.  Please wash hands frequently.  Please increase fluids.  Use Tylenol every 4 hours, or ibuprofen every 6 hours for fever or aching.  Please use the decongestant of your choice, Claritin-D, Allegra-D, Sudafed each morning.  May use Benadryl at bedtime for congestion if needed.  Please see Dr. Yetta BarreJones for additional evaluation and management if not improving.

## 2017-11-06 ENCOUNTER — Emergency Department (HOSPITAL_COMMUNITY): Admission: EM | Admit: 2017-11-06 | Discharge: 2017-11-06 | Discharge status: Against Medical Advice | Payer: Self-pay

## 2017-11-06 NOTE — ED Notes (Signed)
Called for triage without response 

## 2017-11-06 NOTE — ED Notes (Signed)
Called for triage without response in the lobby

## 2017-11-19 IMAGING — CT CT RENAL STONE PROTOCOL
2 of 4 series · 16 of 46 positions shown, 18 images · non-contrast
Comparison: None.

CLINICAL DATA: Right flank pain started today.

EXAM:
CT ABDOMEN AND PELVIS WITHOUT CONTRAST
TECHNIQUE: Multidetector CT imaging of the abdomen and pelvis was performed
following the standard protocol without IV contrast.

[Series 2: routine abd pel with · axial · 0.74mm/px · z∈[+560,+960]mm · 13 of 88 slices shown, 15 images]
[im 4/88  soft-tissue]
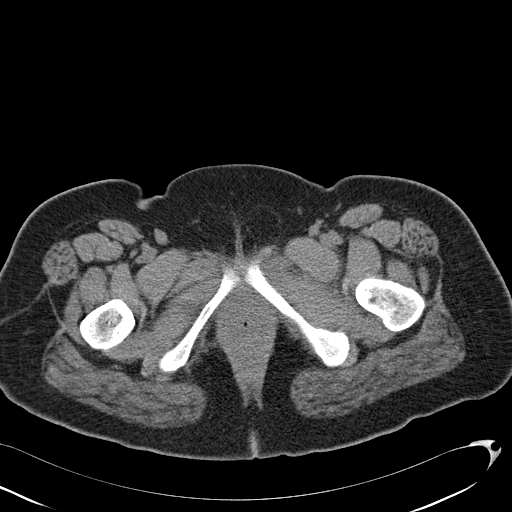
[im 4/88  bone]
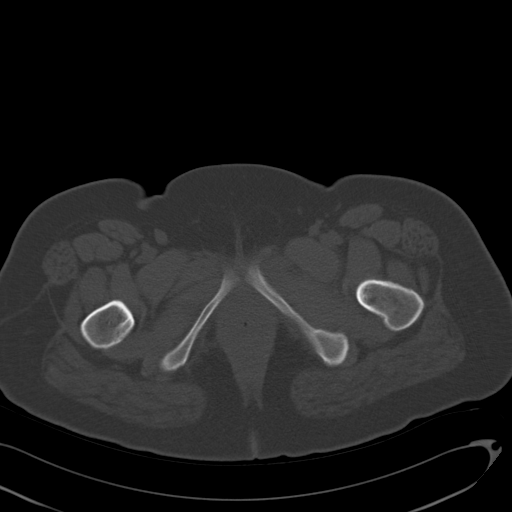
[im 12/88  soft-tissue]
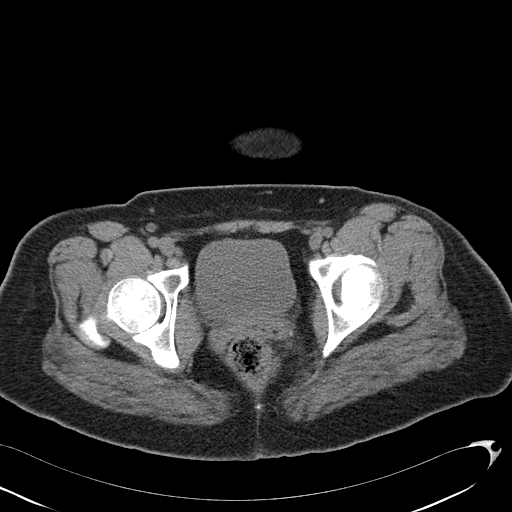
[im 19/88  soft-tissue]
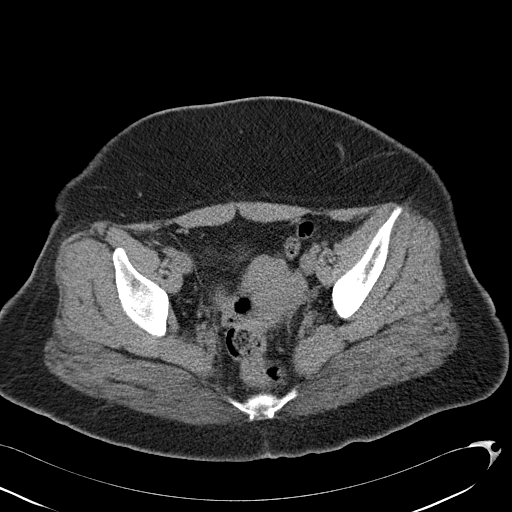
[im 23/88  soft-tissue]
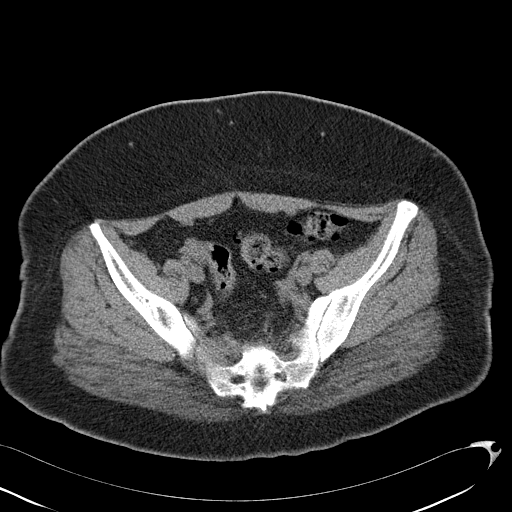
[im 31/88  soft-tissue]
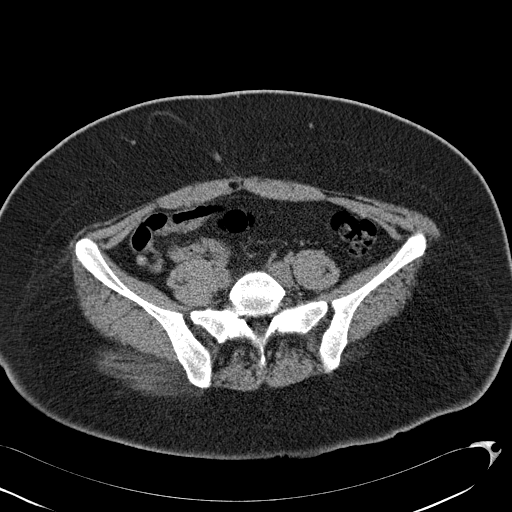
[im 38/88  soft-tissue]
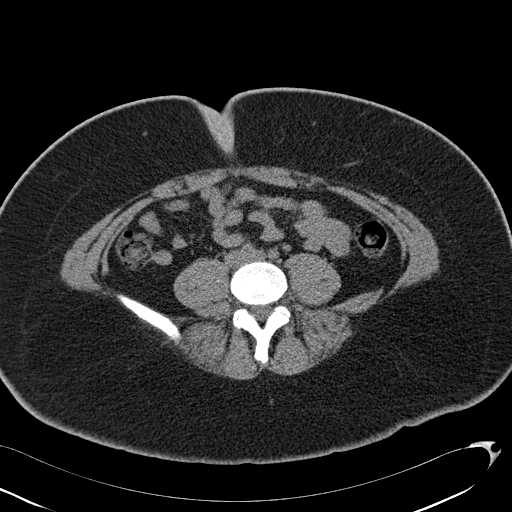
[im 46/88  soft-tissue]
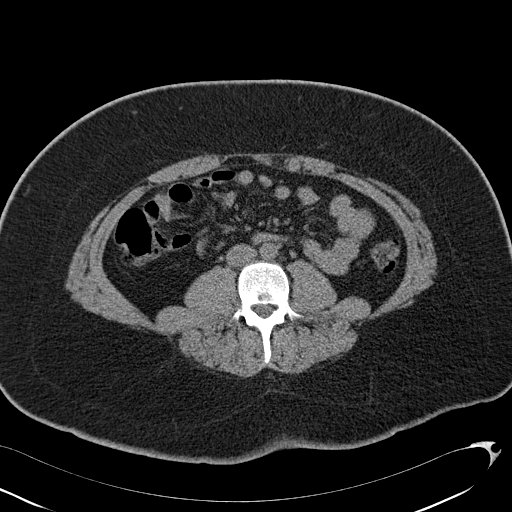
[im 50/88  soft-tissue]
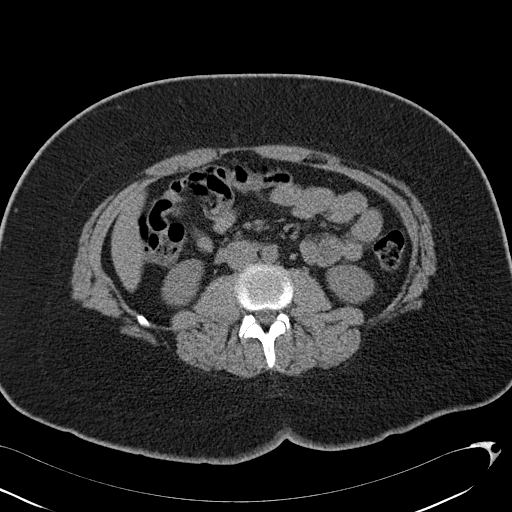
[im 57/88  soft-tissue]
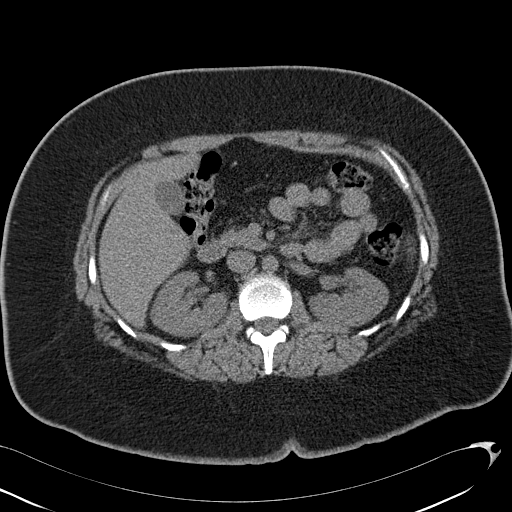
[im 57/88  bone]
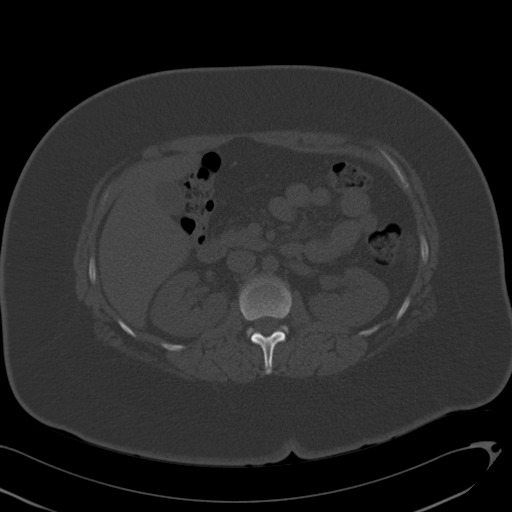
[im 65/88  soft-tissue]
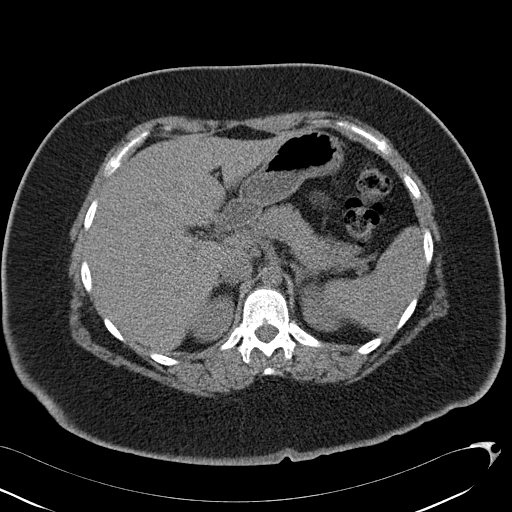
[im 69/88  soft-tissue]
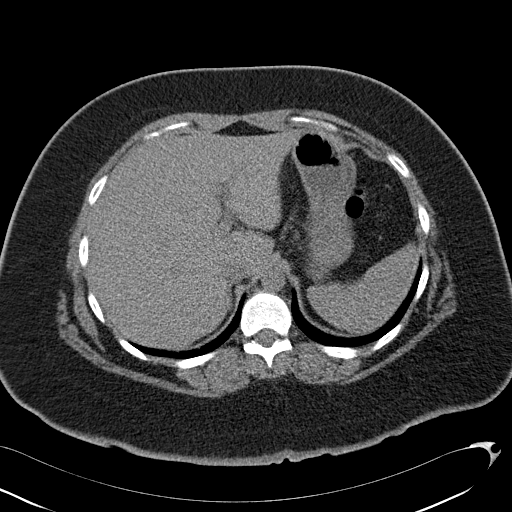
[im 76/88  soft-tissue]
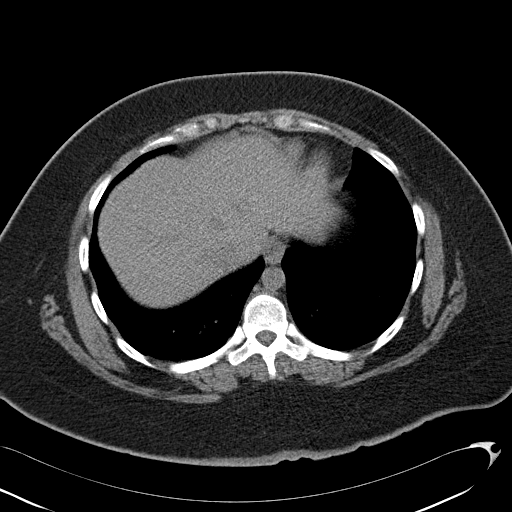
[im 84/88  soft-tissue]
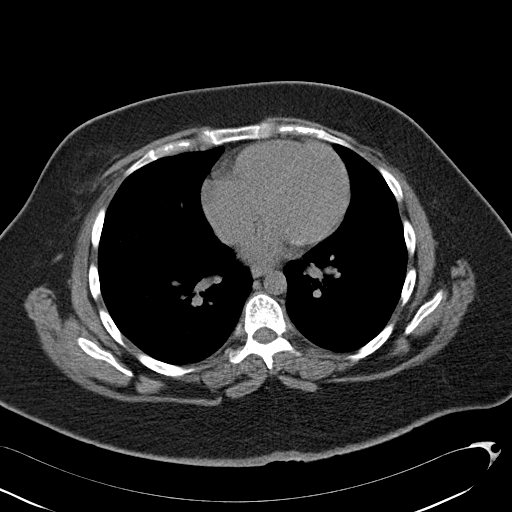

[Series 4: coronal · coronal · 0.72mm/px · 3 of 143 slices shown]
[im 48/143  soft-tissue]
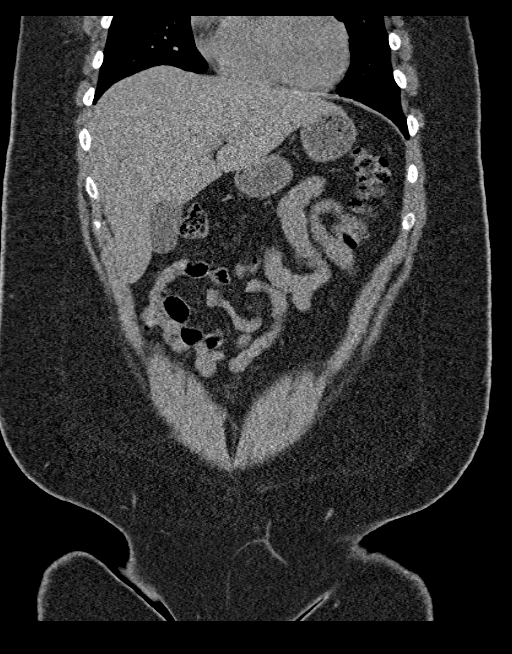
[im 64/143  soft-tissue]
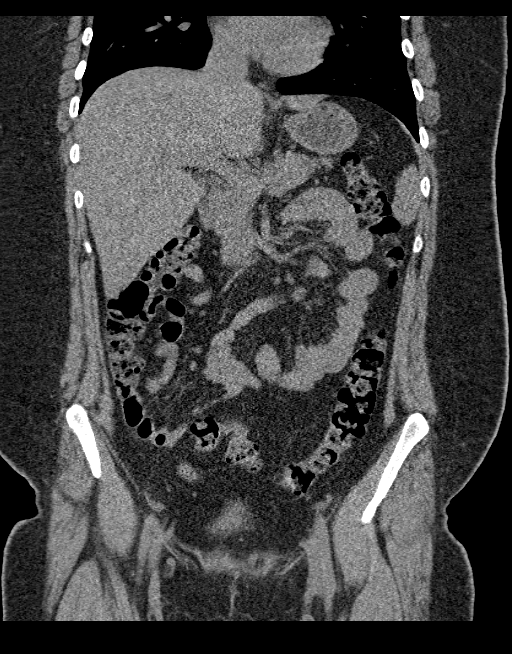
[im 79/143  soft-tissue]
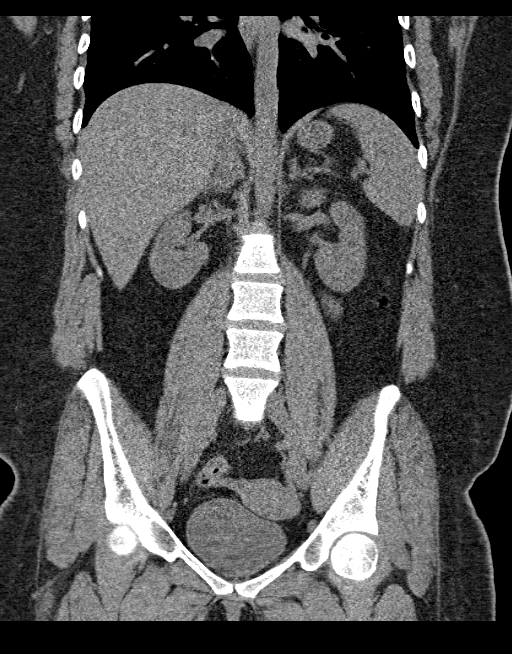

[16 of 46 positions shown; findings below may reference images not displayed]

FINDINGS: Lower chest: The lungs are clear wiithout focal pneumonia, edema,
pneumothorax or pleural effusion.

Hepatobiliary: No focal abnormality in the liver on this study
without intravenous contrast. No evidence of hepatomegaly. There is
no evidence for gallstones, gallbladder wall thickening, or
pericholecystic fluid. No intrahepatic or extrahepatic biliary
dilation.

Pancreas: No focal mass lesion. No dilatation of the main duct. No
intraparenchymal cyst. No peripancreatic edema.

Spleen: No splenomegaly. No focal mass lesion.

Adrenals/Urinary Tract: No adrenal nodule or mass. No mineralized
stones are seen in either kidney. No secondary changes in either
kidney. No focal abnormality identified in the kidneys on this
noncontrast exam. No evidence for hydroureter. The urinary bladder
appears normal for the degree of distention.

Stomach/Bowel: Stomach is nondistended. No gastric wall thickening.
No evidence of outlet obstruction. Duodenum is normally positioned
as is the ligament of Treitz. No small bowel wall thickening. No
small bowel dilatation. The terminal ileum is normal. The appendix
is not visualized, but there is no edema or inflammation in the
region of the cecum. No gross colonic mass. No colonic wall
thickening. No substantial diverticular change.

Vascular/Lymphatic: No abdominal aortic aneurysm. No abdominal
aortic atherosclerotic calcification. There is no gastrohepatic or
hepatoduodenal ligament lymphadenopathy. No intraperitoneal or
retroperitoneal lymphadenopathy. No pelvic sidewall lymphadenopathy.

Reproductive: The uterus has normal CT imaging appearance. There is
no adnexal mass.

Other: No intraperitoneal free fluid.

Musculoskeletal: Bone windows reveal no worrisome lytic or sclerotic
osseous lesions.
IMPRESSION: 1. No acute findings in the abdomen or pelvis. Specifically, no
features to explain the patient's history of right flank pain.

## 2018-01-20 ENCOUNTER — Emergency Department (HOSPITAL_COMMUNITY)
Admission: EM | Admit: 2018-01-20 | Discharge: 2018-01-20 | Disposition: A | Discharge status: Home / Self Care | Payer: Self-pay | Attending: Emergency Medicine | Admitting: Emergency Medicine

## 2018-01-20 ENCOUNTER — Other Ambulatory Visit: Payer: Self-pay

## 2018-01-20 ENCOUNTER — Encounter (HOSPITAL_COMMUNITY): Payer: Self-pay | Admitting: Emergency Medicine

## 2018-01-20 DIAGNOSIS — J0110 Acute frontal sinusitis, unspecified: Secondary | ICD-10-CM | POA: Insufficient documentation

## 2018-01-20 DIAGNOSIS — R51 Headache: Secondary | ICD-10-CM

## 2018-01-20 DIAGNOSIS — R519 Headache, unspecified: Secondary | ICD-10-CM

## 2018-01-20 DIAGNOSIS — J45909 Unspecified asthma, uncomplicated: Secondary | ICD-10-CM | POA: Insufficient documentation

## 2018-01-20 MED ORDER — PROMETHAZINE HCL 12.5 MG PO TABS: 12.5000 mg | ORAL_TABLET | Freq: Four times a day (QID) | ORAL | 0 refills | Status: DC | PRN

## 2018-01-20 MED ORDER — BUTALBITAL-ASPIRIN-CAFFEINE 50-325-40 MG PO CAPS: 2.0000 | ORAL_CAPSULE | Freq: Four times a day (QID) | ORAL | 0 refills | Status: DC | PRN

## 2018-01-20 NOTE — ED Triage Notes (Signed)
Pt c/o of headache x3 days. No eye sensitivity to light or nausea.

## 2018-01-20 NOTE — Discharge Instructions (Signed)
Your vital signs are within normal limits.  Your oxygen level is 100% on room air.  Your examination does suggest some sinus congestion.  There are no neurologic deficits appreciated at this time.  Please see Dr. Neale BurlyFreeman, or member of his team for neurology evaluation of your headaches.  Please use Claritin-D, Sudafed, or the decongesting medication of your choice.  Please use Fiorinal every 6 hours for headache.  Use promethazine for nausea.  These medications may cause drowsiness, please use the with caution.

## 2018-01-20 NOTE — ED Provider Notes (Signed)
[facility] EMERGENCY DEPARTMENT Provider Note   CSN: [id number] Arrival date & time: 01/20/18  1817     History   Chief Complaint Chief Complaint  Patient presents with  . Headache    HPI Diane Parks is a 27 y.o. female.  The history is provided by the patient.  Headache   This is a new problem. The current episode started more than 2 days ago. The problem occurs hourly. The problem has been gradually worsening. The headache is associated with nothing. The pain is located in the frontal region. The quality of the pain is described as throbbing. The pain is moderate. The pain does not radiate. Associated symptoms include nausea. Pertinent negatives include no fever, no near-syncope, no palpitations, no syncope, no shortness of breath and no vomiting. She has tried acetaminophen for the symptoms. The treatment provided no relief.    Past Medical History:  Diagnosis Date  . Asthma   . BV (bacterial vaginosis) 06/20/2015  . Chlamydia   . Hidradenitis   . History of chlamydia 07/20/2015  . Missed period 12/06/2015  . Obesity   . PCB (post coital bleeding) 12/06/2015  . UTI (lower urinary tract infection) 06/20/2015  . Vaginal discharge 06/20/2015    Patient Active Problem List   Diagnosis Date Noted  . Encounter for initial prescription of injectable contraceptive 06/05/2017  . Encounter for gynecological examination with Papanicolaou smear of cervix 06/05/2017  . PCB (post coital bleeding) 12/06/2015  . Missed period 12/06/2015  . History of chlamydia 07/20/2015  . Vaginal discharge 06/20/2015  . BV (bacterial vaginosis) 06/20/2015  . UTI (lower urinary tract infection) 06/20/2015  . Hidradenitis 03/15/2015  . Obesity 03/07/2014  . Absence of menstruation 02/18/2013    Past Surgical History:  Procedure Laterality Date  . EYE SURGERY    . EYE SURGERY      OB History    Gravida Para Term Preterm AB Living   0             SAB TAB Ectopic Multiple Live Births               Home Medications    Prior to Admission medications   Medication Sig Start Date End Date Taking? Authorizing Provider  medroxyPROGESTERone (DEPO-PROVERA) 150 MG/ML injection Inject 1 mL (150 mg total) into the muscle every 3 (three) months. 06/05/17   Adline Potter, NP    Family History Family History  Problem Relation Age of Onset  . Cancer Maternal Grandmother   . Cancer Maternal Grandfather   . Cancer Paternal Grandmother   . Cancer Paternal Grandfather   . Hypertension Mother   . Heart disease Mother     Social History Social History   Tobacco Use  . Smoking status: Never Smoker  . Smokeless tobacco: Never Used  Substance Use Topics  . Alcohol use: No  . Drug use: No     Allergies   Patient has no known allergies.   Review of Systems Review of Systems  Constitutional: Negative for activity change and fever.       All ROS Neg except as noted in HPI  HENT: Negative for nosebleeds.   Eyes: Negative for photophobia and discharge.  Respiratory: Negative for cough, shortness of breath and wheezing.   Cardiovascular: Negative for chest pain, palpitations, syncope and near-syncope.  Gastrointestinal: Positive for nausea. Negative for abdominal pain, blood in stool and vomiting.  Genitourinary: Negative for dysuria, frequency and hematuria.  Musculoskeletal: Negative for arthralgias,  back pain and neck pain.  Skin: Negative.   Neurological: Positive for headaches. Negative for dizziness, seizures and speech difficulty.  Psychiatric/Behavioral: Negative for confusion and hallucinations.     Physical Exam Updated Vital Signs BP 118/60 (BP Location: Right Arm)   Pulse 83   Temp 99.2 F (37.3 C) (Oral)   Resp 13   Ht 5\' 1"  (1.549 m)   Wt 77.1 kg (170 lb)   SpO2 100%   BMI 32.12 kg/m   Physical Exam  Constitutional: She appears well-developed and well-nourished. No distress.  HENT:  Head: Normocephalic and atraumatic.  Right Ear: External  ear normal.  Left Ear: External ear normal.  Nasal congestion present.  There is mild pain to percussion over the left frontal sinus area.  Eyes: Conjunctivae are normal. Right eye exhibits no discharge. Left eye exhibits no discharge. No scleral icterus.  Neck: Neck supple. No tracheal deviation present.  Cardiovascular: Normal rate, regular rhythm and intact distal pulses.  Pulmonary/Chest: Effort normal and breath sounds normal. No stridor. No respiratory distress. She has no wheezes. She has no rales.  Abdominal: Soft. Bowel sounds are normal. She exhibits no distension. There is no tenderness. There is no rebound and no guarding.  Musculoskeletal: She exhibits no edema or tenderness.  Neurological: She is alert. She has normal strength. No cranial nerve deficit (no facial droop, extraocular movements intact, no slurred speech) or sensory deficit. She exhibits normal muscle tone. She displays no seizure activity. Coordination normal.  Skin: Skin is warm and dry. No rash noted.  Psychiatric: She has a normal mood and affect.  Nursing note and vitals reviewed.    ED Treatments / Results  Labs (all labs ordered are listed, but only abnormal results are displayed) Labs Reviewed - No data to display  EKG  EKG Interpretation None       Radiology No results found.  Procedures Procedures (including critical care time)  Medications Ordered in ED Medications - No data to display   Initial Impression / Assessment and Plan / ED Course  I have reviewed the triage vital signs and the nursing notes.  Pertinent labs & imaging results that were available during my care of the patient were reviewed by me and considered in my medical decision making (see chart for details).       Final Clinical Impressions(s) / ED Diagnoses MDM  Vital signs reviewed.  Pulse oximetry is 100% on room air.  Within normal limits by my interpretation.  The examination is negative for any gross  neurologic deficits appreciated.  The patient will be referred to Dr. Neale BurlyFreeman with headache wellness for additional evaluation and management.  Prescription for Fiorinal and promethazine given to the patient to use for headache and nausea.  Patient advised to use these medications with caution as they can cause drowsiness.  Patient is in agreement with this plan.   Final diagnoses:  Bad headache  Acute frontal sinusitis, recurrence not specified    ED Discharge Orders    None       Ivery QualeBryant, Megham Dwyer, Cordelia Poche-C 01/20/18 2008    Eber HongMiller, Brian, MD 01/21/18 1036

## 2018-03-08 ENCOUNTER — Emergency Department (HOSPITAL_COMMUNITY)
Admission: EM | Admit: 2018-03-08 | Discharge: 2018-03-08 | Disposition: A | Discharge status: Home / Self Care | Payer: Self-pay | Attending: Emergency Medicine | Admitting: Emergency Medicine

## 2018-03-08 ENCOUNTER — Encounter (HOSPITAL_COMMUNITY): Payer: Self-pay | Admitting: Emergency Medicine

## 2018-03-08 ENCOUNTER — Other Ambulatory Visit: Payer: Self-pay

## 2018-03-08 DIAGNOSIS — Z79899 Other long term (current) drug therapy: Secondary | ICD-10-CM | POA: Insufficient documentation

## 2018-03-08 DIAGNOSIS — J45909 Unspecified asthma, uncomplicated: Secondary | ICD-10-CM | POA: Insufficient documentation

## 2018-03-08 DIAGNOSIS — Z3202 Encounter for pregnancy test, result negative: Secondary | ICD-10-CM | POA: Insufficient documentation

## 2018-03-08 DIAGNOSIS — R110 Nausea: Secondary | ICD-10-CM | POA: Insufficient documentation

## 2018-03-08 LAB — PREGNANCY, URINE: Preg Test, Ur: NEGATIVE

## 2018-03-08 NOTE — ED Triage Notes (Signed)
Patient states she is here for a pregnancy test. Has not missed a period yet but states she has taken 3 pregnancy tests at home that had a "faint line."

## 2018-03-08 NOTE — ED Notes (Signed)
After being told pregnancy test was negative, patient left without signing or receiving discharge papers.

## 2018-03-08 NOTE — ED Provider Notes (Signed)
[facility] EMERGENCY DEPARTMENT Provider Note   CSN: [id number] Arrival date & time: 03/08/18  2028     History   Chief Complaint Chief Complaint  Patient presents with  . Possible Pregnancy    HPI Diane Parks is a 27 y.o. female.  Took 3 home pregnancy tests and all were faintly "positive" not due for her period for 2 more days, but has had some nausea. No abd pain, vaginal bleeding, pelvic pain.     Past Medical History:  Diagnosis Date  . Asthma   . BV (bacterial vaginosis) 06/20/2015  . Chlamydia   . Hidradenitis   . History of chlamydia 07/20/2015  . Missed period 12/06/2015  . Obesity   . PCB (post coital bleeding) 12/06/2015  . UTI (lower urinary tract infection) 06/20/2015  . Vaginal discharge 06/20/2015    Patient Active Problem List   Diagnosis Date Noted  . Encounter for initial prescription of injectable contraceptive 06/05/2017  . Encounter for gynecological examination with Papanicolaou smear of cervix 06/05/2017  . PCB (post coital bleeding) 12/06/2015  . Missed period 12/06/2015  . History of chlamydia 07/20/2015  . Vaginal discharge 06/20/2015  . BV (bacterial vaginosis) 06/20/2015  . UTI (lower urinary tract infection) 06/20/2015  . Hidradenitis 03/15/2015  . Obesity 03/07/2014  . Absence of menstruation 02/18/2013    Past Surgical History:  Procedure Laterality Date  . EYE SURGERY    . EYE SURGERY       OB History    Gravida  0   Para      Term      Preterm      AB      Living        SAB      TAB      Ectopic      Multiple      Live Births               Home Medications    Prior to Admission medications   Medication Sig Start Date End Date Taking? Authorizing Provider  butalbital-aspirin-caffeine [facility]) [phone] MG capsule Take 2 capsules by mouth every 6 (six) hours as needed for headache. 01/20/18   Ivery Quale, PA-C  promethazine (PHENERGAN) 12.5 MG tablet Take 1 tablet (12.5 mg total) by mouth  every 6 (six) hours as needed for nausea or vomiting. 01/20/18   Ivery Quale, PA-C    Family History Family History  Problem Relation Age of Onset  . Cancer Maternal Grandmother   . Cancer Maternal Grandfather   . Cancer Paternal Grandmother   . Cancer Paternal Grandfather   . Hypertension Mother   . Heart disease Mother     Social History Social History   Tobacco Use  . Smoking status: Never Smoker  . Smokeless tobacco: Never Used  Substance Use Topics  . Alcohol use: No  . Drug use: No     Allergies   Patient has no known allergies.   Review of Systems Review of Systems  Gastrointestinal: Positive for nausea.  All other systems reviewed and are negative.    Physical Exam Updated Vital Signs BP 122/61 (BP Location: Right Arm)   Pulse 81   Temp 98.3 F (36.8 C) (Oral)   Resp 15   Ht  (1.549 m)   Wt 77.1 kg (170 lb)   LMP 02/04/2018   SpO2 98%   BMI 32.12 kg/m   Physical Exam  Constitutional: She is oriented to person, place, and time.  She appears well-developed and well-nourished. No distress.  HENT:  Head: Normocephalic and atraumatic.  Right Ear: Hearing normal.  Left Ear: Hearing normal.  Nose: Nose normal.  Mouth/Throat: Oropharynx is clear and moist and mucous membranes are normal.  Eyes: Pupils are equal, round, and reactive to light. Conjunctivae and EOM are normal.  Neck: Normal range of motion. Neck supple.  Cardiovascular: Regular rhythm, S1 normal and S2 normal. Exam reveals no gallop and no friction rub.  No murmur heard. Pulmonary/Chest: Effort normal and breath sounds normal. No respiratory distress. She exhibits no tenderness.  Abdominal: Soft. Normal appearance and bowel sounds are normal. There is no hepatosplenomegaly. There is no tenderness. There is no rebound, no guarding, no tenderness at McBurney's point and negative Murphy's sign. No hernia.  Musculoskeletal: Normal range of motion.  Neurological: She is alert and  oriented to person, place, and time. She has normal strength. No cranial nerve deficit or sensory deficit. Coordination normal. GCS eye subscore is 4. GCS verbal subscore is 5. GCS motor subscore is 6.  Skin: Skin is warm, dry and intact. No rash noted. No cyanosis.  Psychiatric: She has a normal mood and affect. Her speech is normal and behavior is normal. Thought content normal.  Nursing note and vitals reviewed.    ED Treatments / Results  Labs (all labs ordered are listed, but only abnormal results are displayed) Labs Reviewed  PREGNANCY, URINE    EKG None  Radiology No results found.  Procedures Procedures (including critical care time)  Medications Ordered in ED Medications - No data to display   Initial Impression / Assessment and Plan / ED Course  I have reviewed the triage vital signs and the nursing notes.  Pertinent labs & imaging results that were available during my care of the patient were reviewed by me and considered in my medical decision making (see chart for details).     Patient with essentially no symptoms comes to the ER to get a pregnancy test. She left the ER immediately after being told her test here was negative. Abdominal exam benign, no concern for acute surgical process.  Final Clinical Impressions(s) / ED Diagnoses   Final diagnoses:  Nausea    ED Discharge Orders    None       Gilda Crease, MD 03/08/18 2327

## 2018-03-12 ENCOUNTER — Emergency Department (HOSPITAL_COMMUNITY)
Admission: EM | Admit: 2018-03-12 | Discharge: 2018-03-12 | Disposition: A | Discharge status: Home / Self Care | Payer: No Typology Code available for payment source | Attending: Emergency Medicine | Admitting: Emergency Medicine

## 2018-03-12 ENCOUNTER — Encounter (HOSPITAL_COMMUNITY): Payer: Self-pay | Admitting: Emergency Medicine

## 2018-03-12 ENCOUNTER — Emergency Department (HOSPITAL_COMMUNITY): Payer: No Typology Code available for payment source

## 2018-03-12 ENCOUNTER — Other Ambulatory Visit: Payer: Self-pay

## 2018-03-12 DIAGNOSIS — Y929 Unspecified place or not applicable: Secondary | ICD-10-CM | POA: Diagnosis not present

## 2018-03-12 DIAGNOSIS — S20211A Contusion of right front wall of thorax, initial encounter: Secondary | ICD-10-CM | POA: Diagnosis not present

## 2018-03-12 DIAGNOSIS — S60212A Contusion of left wrist, initial encounter: Secondary | ICD-10-CM | POA: Diagnosis not present

## 2018-03-12 DIAGNOSIS — S20212A Contusion of left front wall of thorax, initial encounter: Secondary | ICD-10-CM | POA: Diagnosis not present

## 2018-03-12 DIAGNOSIS — S6992XA Unspecified injury of left wrist, hand and finger(s), initial encounter: Secondary | ICD-10-CM | POA: Diagnosis present

## 2018-03-12 DIAGNOSIS — S20219A Contusion of unspecified front wall of thorax, initial encounter: Secondary | ICD-10-CM

## 2018-03-12 DIAGNOSIS — J45909 Unspecified asthma, uncomplicated: Secondary | ICD-10-CM | POA: Insufficient documentation

## 2018-03-12 DIAGNOSIS — Y999 Unspecified external cause status: Secondary | ICD-10-CM | POA: Insufficient documentation

## 2018-03-12 DIAGNOSIS — Y939 Activity, unspecified: Secondary | ICD-10-CM | POA: Insufficient documentation

## 2018-03-12 MED ORDER — IBUPROFEN 600 MG PO TABS: 600.0000 mg | ORAL_TABLET | Freq: Four times a day (QID) | ORAL | 0 refills | Status: DC | PRN

## 2018-03-12 MED ORDER — CYCLOBENZAPRINE HCL 5 MG PO TABS: 5.0000 mg | ORAL_TABLET | Freq: Two times a day (BID) | ORAL | 0 refills | Status: DC | PRN
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
## 2018-09-16 ENCOUNTER — Emergency Department (HOSPITAL_COMMUNITY): Payer: Medicaid Other

## 2018-09-16 ENCOUNTER — Emergency Department (HOSPITAL_COMMUNITY)
Admission: EM | Admit: 2018-09-16 | Discharge: 2018-09-16 | Disposition: A | Discharge status: Home / Self Care | Payer: Medicaid Other | Attending: Emergency Medicine | Admitting: Emergency Medicine

## 2018-09-16 ENCOUNTER — Other Ambulatory Visit: Payer: Self-pay

## 2018-09-16 ENCOUNTER — Encounter (HOSPITAL_COMMUNITY): Payer: Self-pay | Admitting: Emergency Medicine

## 2018-09-16 DIAGNOSIS — J45909 Unspecified asthma, uncomplicated: Secondary | ICD-10-CM | POA: Insufficient documentation

## 2018-09-16 DIAGNOSIS — N10 Acute tubulo-interstitial nephritis: Secondary | ICD-10-CM | POA: Insufficient documentation

## 2018-09-16 DIAGNOSIS — Z79899 Other long term (current) drug therapy: Secondary | ICD-10-CM | POA: Insufficient documentation

## 2018-09-16 LAB — CBC WITH DIFFERENTIAL/PLATELET
Abs Immature Granulocytes: 0.05 10*3/uL (ref 0.00–0.07)
BASOS ABS: 0.1 10*3/uL (ref 0.0–0.1)
Basophils Relative: 0 %
EOS ABS: 0.1 10*3/uL (ref 0.0–0.5)
EOS PCT: 1 %
HCT: 41 % (ref 36.0–46.0)
Hemoglobin: 13.1 g/dL (ref 12.0–15.0)
Immature Granulocytes: 0 %
LYMPHS ABS: 4.3 10*3/uL — AB (ref 0.7–4.0)
LYMPHS PCT: 31 %
MCH: 28.1 pg (ref 26.0–34.0)
MCHC: 32 g/dL (ref 30.0–36.0)
MCV: 87.8 fL (ref 80.0–100.0)
Monocytes Absolute: 1.3 10*3/uL — ABNORMAL HIGH (ref 0.1–1.0)
Monocytes Relative: 10 %
NEUTROS PCT: 58 %
NRBC: 0 % (ref 0.0–0.2)
Neutro Abs: 8.2 10*3/uL — ABNORMAL HIGH (ref 1.7–7.7)
Platelets: 321 10*3/uL (ref 150–400)
RBC: 4.67 MIL/uL (ref 3.87–5.11)
RDW: 13.2 % (ref 11.5–15.5)
WBC: 14.1 10*3/uL — AB (ref 4.0–10.5)

## 2018-09-16 LAB — URINALYSIS, ROUTINE W REFLEX MICROSCOPIC
Bilirubin Urine: NEGATIVE
GLUCOSE, UA: NEGATIVE mg/dL
KETONES UR: NEGATIVE mg/dL
NITRITE: POSITIVE — AB
PH: 7 (ref 5.0–8.0)
Protein, ur: NEGATIVE mg/dL
SPECIFIC GRAVITY, URINE: 1.016 (ref 1.005–1.030)
WBC, UA: >50 WBC/hpf — ABNORMAL HIGH (ref 0–5)

## 2018-09-16 LAB — BASIC METABOLIC PANEL
ANION GAP: 9 (ref 5–15)
BUN: 9 mg/dL (ref 6–20)
CHLORIDE: 106 mmol/L (ref 98–111)
CO2: 23 mmol/L (ref 22–32)
CREATININE: 0.73 mg/dL (ref 0.44–1.00)
Calcium: 9.3 mg/dL (ref 8.9–10.3)
GFR calc non Af Amer: >60 mL/min (ref >60)
Glucose, Bld: 92 mg/dL (ref 70–99)
Potassium: 3.5 mmol/L (ref 3.5–5.1)
SODIUM: 138 mmol/L (ref 135–145)

## 2018-09-16 LAB — POC URINE PREG, ED: Preg Test, Ur: NEGATIVE

## 2018-09-16 MED ORDER — ACETAMINOPHEN 325 MG PO TABS
ORAL_TABLET | ORAL | Status: AC
  Filled 2018-09-16: qty 2

## 2018-09-16 MED ORDER — ACETAMINOPHEN 325 MG PO TABS
650.0000 mg | ORAL_TABLET | Freq: Once | ORAL | Status: AC | PRN
  Administered 2018-09-16: 650 mg via ORAL

## 2018-09-16 MED ORDER — SULFAMETHOXAZOLE-TRIMETHOPRIM 800-160 MG PO TABS
1.0000 | ORAL_TABLET | Freq: Once | ORAL | Status: AC
  Administered 2018-09-16: 1 via ORAL
  Filled 2018-09-16: qty 1

## 2018-09-16 MED ORDER — SULFAMETHOXAZOLE-TRIMETHOPRIM 800-160 MG PO TABS: 1.0000 | ORAL_TABLET | Freq: Two times a day (BID) | ORAL | 0 refills | Status: AC

## 2018-09-16 NOTE — ED Triage Notes (Signed)
Patient reports waking with dizziness, sore throat, and pain in her L side with breathing. Temp. 102.2 in Triage.

## 2018-09-16 NOTE — ED Provider Notes (Signed)
[facility] EMERGENCY DEPARTMENT Provider Note   CSN: [id number] Arrival date & time: 09/16/18  1504     History   Chief Complaint Chief Complaint  Patient presents with  . Fever    HPI Iantha Fallenvonda Biel is a 27 y.o. female.  HPI Complains of general malaise, fever gradual onset 2 days ago.  Yesterday she developed slight sore throat and nonproductive cough also complains of left flank pain onset yesterday.  Denies urinary symptoms denies nausea or vomiting.  Denies abdominal pain.  Nothing makes symptoms better or worse.  No other associated symptoms.  No treatment prior to coming here.  Past Medical History:  Diagnosis Date  . Asthma   . BV (bacterial vaginosis) 06/20/2015  . Chlamydia   . Hidradenitis   . History of chlamydia 07/20/2015  . Missed period 12/06/2015  . Obesity   . PCB (post coital bleeding) 12/06/2015  . UTI (lower urinary tract infection) 06/20/2015  . Vaginal discharge 06/20/2015    Patient Active Problem List   Diagnosis Date Noted  . Encounter for initial prescription of injectable contraceptive 06/05/2017  . Encounter for gynecological examination with Papanicolaou smear of cervix 06/05/2017  . PCB (post coital bleeding) 12/06/2015  . Missed period 12/06/2015  . History of chlamydia 07/20/2015  . Vaginal discharge 06/20/2015  . BV (bacterial vaginosis) 06/20/2015  . UTI (lower urinary tract infection) 06/20/2015  . Hidradenitis 03/15/2015  . Obesity 03/07/2014  . Absence of menstruation 02/18/2013    Past Surgical History:  Procedure Laterality Date  . EYE SURGERY    . EYE SURGERY       OB History    Gravida  0   Para      Term      Preterm      AB      Living        SAB      TAB      Ectopic      Multiple      Live Births               Home Medications    Prior to Admission medications   Medication Sig Start Date End Date Taking? Authorizing Provider  butalbital-aspirin-caffeine [facility](FIORINAL) [phone] MG capsule  Take 2 capsules by mouth every 6 (six) hours as needed for headache. 01/20/18   Ivery QualeBryant, Hobson, PA-C  cyclobenzaprine (FLEXERIL) 5 MG tablet Take 1 tablet (5 mg total) by mouth 2 (two) times daily as needed for muscle spasms. 03/12/18   Burgess AmorIdol, Julie, PA-C  ibuprofen (ADVIL,MOTRIN) 600 MG tablet Take 1 tablet (600 mg total) by mouth every 6 (six) hours as needed. 03/12/18   Burgess AmorIdol, Julie, PA-C  promethazine (PHENERGAN) 12.5 MG tablet Take 1 tablet (12.5 mg total) by mouth every 6 (six) hours as needed for nausea or vomiting. 01/20/18   Ivery QualeBryant, Hobson, PA-C    Family History Family History  Problem Relation Age of Onset  . Cancer Maternal Grandmother   . Cancer Maternal Grandfather   . Cancer Paternal Grandmother   . Cancer Paternal Grandfather   . Hypertension Mother   . Heart disease Mother     Social History Social History   Tobacco Use  . Smoking status: Never Smoker  . Smokeless tobacco: Never Used  Substance Use Topics  . Alcohol use: No  . Drug use: No     Allergies   Patient has no known allergies.   Review of Systems Review of Systems  Constitutional: Positive for  fever.       Generalized malaise  HENT: Positive for sore throat.   Respiratory: Positive for cough.   Cardiovascular: Negative.   Gastrointestinal: Negative.   Genitourinary: Positive for flank pain.       Irregular menses  Skin: Negative.   Neurological: Negative.   Psychiatric/Behavioral: Negative.   All other systems reviewed and are negative.    Physical Exam Updated Vital Signs BP 129/77 (BP Location: Left Arm)   Pulse (!) 112   Temp (!) 102.2 F (39 C) (Oral)   Resp 20   Ht 5\' 1"  (1.549 m)   Wt 77.1 kg   LMP 09/04/2018   SpO2 98%   BMI 32.12 kg/m   Physical Exam  Constitutional: She appears well-developed and well-nourished.  HENT:  Head: Normocephalic and atraumatic.  Oropharynx reddened.  No exudate.  Uvula midline.  Tonsils not large  Eyes: Pupils are equal, round, and reactive to  light. Conjunctivae are normal.  Neck: Neck supple. No tracheal deviation present. No thyromegaly present.  Cardiovascular: Regular rhythm. Exam reveals no friction rub.  No murmur heard. Mildly tachycardic  Pulmonary/Chest: Effort normal and breath sounds normal.  Abdominal: Soft. Bowel sounds are normal. She exhibits no distension. There is no tenderness.  Genitourinary:  Genitourinary Comments: Left flank tenderness, mild  Musculoskeletal: Normal range of motion. She exhibits no edema or tenderness.  Lymphadenopathy:    She has no cervical adenopathy.  Neurological: She is alert. Coordination normal.  Skin: Skin is warm and dry. No rash noted.  Psychiatric: She has a normal mood and affect.  Nursing note and vitals reviewed.    ED Treatments / Results  Labs (all labs ordered are listed, but only abnormal results are displayed) Labs Reviewed  URINALYSIS, ROUTINE W REFLEX MICROSCOPIC  POC URINE PREG, ED    EKG None  Radiology No results found.  Procedures Procedures (including critical care time)  Medications Ordered in ED Medications  acetaminophen (TYLENOL) tablet 650 mg (650 mg Oral Given 09/16/18 1520)   Results for orders placed or performed during the hospital encounter of 09/16/18  Urinalysis, Routine w reflex microscopic  Result Value Ref Range   Color, Urine YELLOW YELLOW   APPearance CLOUDY (A) CLEAR   Specific Gravity, Urine 1.016 1.005 - 1.030   pH 7.0 5.0 - 8.0   Glucose, UA NEGATIVE NEGATIVE mg/dL   Hgb urine dipstick SMALL (A) NEGATIVE   Bilirubin Urine NEGATIVE NEGATIVE   Ketones, ur NEGATIVE NEGATIVE mg/dL   Protein, ur NEGATIVE NEGATIVE mg/dL   Nitrite POSITIVE (A) NEGATIVE   Leukocytes, UA MODERATE (A) NEGATIVE   RBC / HPF 21-50 0 - 5 RBC/hpf   WBC, UA >50 (H) 0 - 5 WBC/hpf   Bacteria, UA FEW (A) NONE SEEN   Squamous Epithelial / LPF 21-50 0 - 5   WBC Clumps PRESENT    Mucus PRESENT   Basic metabolic panel  Result Value Ref Range    Sodium 138 135 - 145 mmol/L   Potassium 3.5 3.5 - 5.1 mmol/L   Chloride 106 98 - 111 mmol/L   CO2 23 22 - 32 mmol/L   Glucose, Bld 92 70 - 99 mg/dL   BUN 9 6 - 20 mg/dL   Creatinine, Ser 1.61 0.44 - 1.00 mg/dL   Calcium 9.3 8.9 - 09.6 mg/dL   GFR calc non Af Amer >60 >60 mL/min   GFR calc Af Amer >60 >60 mL/min   Anion gap 9 5 - 15  CBC with Differential/Platelet  Result Value Ref Range   WBC 14.1 (H) 4.0 - 10.5 K/uL   RBC 4.67 3.87 - 5.11 MIL/uL   Hemoglobin 13.1 12.0 - 15.0 g/dL   HCT 16.1 09.6 - 04.5 %   MCV 87.8 80.0 - 100.0 fL   MCH 28.1 26.0 - 34.0 pg   MCHC 32.0 30.0 - 36.0 g/dL   RDW 40.9 81.1 - 91.4 %   Platelets 321 150 - 400 K/uL   nRBC 0.0 0.0 - 0.2 %   Neutrophils Relative % 58 %   Neutro Abs 8.2 (H) 1.7 - 7.7 K/uL   Lymphocytes Relative 31 %   Lymphs Abs 4.3 (H) 0.7 - 4.0 K/uL   Monocytes Relative 10 %   Monocytes Absolute 1.3 (H) 0.1 - 1.0 K/uL   Eosinophils Relative 1 %   Eosinophils Absolute 0.1 0.0 - 0.5 K/uL   Basophils Relative 0 %   Basophils Absolute 0.1 0.0 - 0.1 K/uL   Immature Granulocytes 0 %   Abs Immature Granulocytes 0.05 0.00 - 0.07 K/uL  POC Urine Pregnancy, ED (do NOT order at [facility])  Result Value Ref Range   Preg Test, Ur NEGATIVE NEGATIVE   Dg Chest 2 View  Result Date: 09/16/2018 CLINICAL DATA:  Cough, fever. EXAM: CHEST - 2 VIEW COMPARISON:  Radiographs of Mar 12, 2018. FINDINGS: The heart size and mediastinal contours are within normal limits. Both lungs are clear. No pneumothorax or pleural effusion is noted. The visualized skeletal structures are unremarkable. IMPRESSION: No active cardiopulmonary disease. Electronically Signed   By: Lupita Raider, M.D.   On: 09/16/2018 16:50    Lab work consistent with urinary tract infection and acute pyelonephritis.  Initial Impression / Assessment and Plan / ED Course  I have reviewed the triage vital signs and the nursing notes.  Pertinent labs & imaging results that were available during my  care of the patient were reviewed by me and considered in my medical decision making (see chart for details).   plan urine sent for culture.  Tylenol for fever or aches.  Prescription Bactrim DS.  Referral [facility] for [facility].  Patient is suitable for outpatient therapy.  No vomiting.  Not ill-appearing  Final Clinical Impressions(s) / ED Diagnoses  Diagnosis acute pyelonephritis Final diagnoses:  None    ED Discharge Orders    None       Doug Sou, MD 09/16/18 1934

## 2018-09-16 NOTE — Discharge Instructions (Signed)
Make sure that you drink at least six 8 ounce glasses of water or Gatorade each day in order to stay well-hydrated.  Take Tylenol 650 mg every 4 hours while awake for aches or for temperature higher than 100.4.  Take the antibiotic as prescribed.  [facility] and to be seen if not feeling better in a week .  Return for vomiting, or if feeling worse for any reason

## 2018-09-19 LAB — URINE CULTURE
Culture: >=100000 — AB
Special Requests: NORMAL

## 2018-09-20 ENCOUNTER — Telehealth: Payer: Self-pay

## 2018-09-20 NOTE — Telephone Encounter (Signed)
Post ED Visit - Positive Culture Follow-up  Culture report reviewed by antimicrobial stewardship pharmacist:  []  Enzo BiNathan Batchelder, Pharm.D. []  Celedonio MiyamotoJeremy Frens, Pharm.D., BCPS AQ-ID [x]  Garvin FilaMike Maccia, Pharm.D., BCPS []  Georgina PillionElizabeth Martin, Pharm.D., BCPS []  CovingtonMinh Pham, [address] Rainbow BoulevardPharm.D., BCPS, AAHIVP []  Estella HuskMichelle Turner, Pharm.D., BCPS, AAHIVP []  Lysle Pearlachel Rumbarger, PharmD, BCPS []  Phillips Climeshuy Dang, PharmD, BCPS []  Agapito GamesAlison Masters, PharmD, BCPS []  Verlan FriendsErin Deja, PharmD  Positive urine culture Treated with Bactrim DS, organism sensitive to the same and no further patient follow-up is required at this time.  Jerry CarasCullom, Delanda Bulluck Burnett 09/20/2018, 11:05 AM

## 2018-09-24 ENCOUNTER — Other Ambulatory Visit: Payer: Self-pay | Admitting: Women's Health

## 2018-11-07 ENCOUNTER — Other Ambulatory Visit: Payer: Self-pay

## 2018-11-07 ENCOUNTER — Encounter (HOSPITAL_COMMUNITY): Payer: Self-pay | Admitting: Emergency Medicine

## 2018-11-07 ENCOUNTER — Emergency Department (HOSPITAL_COMMUNITY)
Admission: EM | Admit: 2018-11-07 | Discharge: 2018-11-07 | Disposition: A | Discharge status: Home / Self Care | Payer: Medicaid Other | Attending: Emergency Medicine | Admitting: Emergency Medicine

## 2018-11-07 DIAGNOSIS — J45909 Unspecified asthma, uncomplicated: Secondary | ICD-10-CM | POA: Insufficient documentation

## 2018-11-07 DIAGNOSIS — R69 Illness, unspecified: Secondary | ICD-10-CM

## 2018-11-07 DIAGNOSIS — J111 Influenza due to unidentified influenza virus with other respiratory manifestations: Secondary | ICD-10-CM | POA: Insufficient documentation

## 2018-11-07 MED ORDER — IBUPROFEN 800 MG PO TABS: 800.0000 mg | ORAL_TABLET | Freq: Three times a day (TID) | ORAL | 0 refills | Status: DC

## 2018-11-07 MED ORDER — PROMETHAZINE-DM 6.25-15 MG/5ML PO SYRP: 5.0000 mL | ORAL_SOLUTION | Freq: Four times a day (QID) | ORAL | 0 refills | Status: DC | PRN

## 2018-11-07 MED ORDER — ONDANSETRON 8 MG PO TBDP
8.0000 mg | ORAL_TABLET | Freq: Once | ORAL | Status: AC
  Administered 2018-11-07: 8 mg via ORAL
  Filled 2018-11-07: qty 1

## 2018-11-07 MED ORDER — IBUPROFEN 800 MG PO TABS
800.0000 mg | ORAL_TABLET | Freq: Once | ORAL | Status: AC
  Administered 2018-11-07: 800 mg via ORAL
  Filled 2018-11-07: qty 1

## 2018-11-07 NOTE — ED Provider Notes (Signed)
[facility] EMERGENCY DEPARTMENT Provider Note   CSN: [id number] Arrival date & time: 11/07/18  2107     History   Chief Complaint Chief Complaint  Patient presents with  . Nasal Congestion    HPI Diane Parks is a 28 y.o. female.  HPI   Diane Parks is a 28 y.o. female who presents to the Emergency Department complaining of generalized body aches, nasal congestion, and shaking chills.  Symptoms began approximately 12 hours ago.  She states she noticed body aches after taking a nap earlier today and states she was shaking uncontrollably and felt dizzy.  She took some over-the-counter cold medication which provided some relief.  She has not been drinking fluids as usual today.  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  She denies chest pain, shortness of breath, abdominal pain, vomiting or diarrhea, and dysuria.  No known fever.     Past Medical History:  Diagnosis Date  . Asthma   . BV (bacterial vaginosis) 06/20/2015  . Chlamydia   . Hidradenitis   . History of chlamydia 07/20/2015  . Missed period 12/06/2015  . Obesity   . PCB (post coital bleeding) 12/06/2015  . UTI (lower urinary tract infection) 06/20/2015  . Vaginal discharge 06/20/2015    Patient Active Problem List   Diagnosis Date Noted  . Encounter for initial prescription of injectable contraceptive 06/05/2017  . Encounter for gynecological examination with Papanicolaou smear of cervix 06/05/2017  . PCB (post coital bleeding) 12/06/2015  . Missed period 12/06/2015  . History of chlamydia 07/20/2015  . Vaginal discharge 06/20/2015  . BV (bacterial vaginosis) 06/20/2015  . UTI (lower urinary tract infection) 06/20/2015  . Hidradenitis 03/15/2015  . Obesity 03/07/2014  . Absence of menstruation 02/18/2013    Past Surgical History:  Procedure Laterality Date  . EYE SURGERY    . EYE SURGERY       OB History    Gravida  0   Para      Term      Preterm      AB      Living        SAB      TAB      Ectopic      Multiple      Live Births               Home Medications    Prior to Admission medications   Not on File    Family History Family History  Problem Relation Age of Onset  . Cancer Maternal Grandmother   . Cancer Maternal Grandfather   . Cancer Paternal Grandmother   . Cancer Paternal Grandfather   . Hypertension Mother   . Heart disease Mother     Social History Social History   Tobacco Use  . Smoking status: Never Smoker  . Smokeless tobacco: Never Used  Substance Use Topics  . Alcohol use: No  . Drug use: No     Allergies   Patient has no known allergies.   Review of Systems Review of Systems  Constitutional: Positive for chills and fatigue. Negative for activity change, appetite change and fever.  HENT: Positive for congestion and rhinorrhea. Negative for facial swelling, sore throat and trouble swallowing.   Eyes: Negative for visual disturbance.  Respiratory: Negative for cough, shortness of breath, wheezing and stridor.   Cardiovascular: Negative for chest pain.  Gastrointestinal: Negative for abdominal pain, diarrhea, nausea and vomiting.  Genitourinary:  Negative for dysuria and flank pain.  Musculoskeletal: Positive for myalgias (generalized body aches). Negative for neck pain and neck stiffness.  Skin: Negative for rash.  Neurological: Negative for dizziness, weakness, numbness and headaches.  Hematological: Negative for adenopathy.  Psychiatric/Behavioral: Negative for confusion.     Physical Exam Updated Vital Signs BP 129/68 (BP Location: Right Arm)   Pulse 96   Temp 100.1 F (37.8 C) (Oral)   Resp 17   Ht 5\' 1"  (1.549 m)   Wt 77.1 kg   SpO2 97%   BMI 32.12 kg/m   Physical Exam Vitals signs and nursing note reviewed.  Constitutional:      Appearance: Normal appearance. She is normal weight. She is not ill-appearing.  HENT:     Head: Atraumatic.     Right Ear: Tympanic  membrane and ear canal normal.     Left Ear: Tympanic membrane and ear canal normal.     Nose: Congestion present.     Mouth/Throat:     Mouth: Mucous membranes are moist.     Pharynx: Oropharynx is clear. No oropharyngeal exudate or posterior oropharyngeal erythema.  Neck:     Musculoskeletal: Full passive range of motion without pain and normal range of motion. No neck rigidity.     Meningeal: Kernig's sign absent.  Cardiovascular:     Rate and Rhythm: Regular rhythm. Tachycardia present.     Pulses: Normal pulses.     Comments: Mild tachycardia Pulmonary:     Effort: Pulmonary effort is normal. No respiratory distress.     Breath sounds: Normal breath sounds. No wheezing.  Abdominal:     General: There is no distension.     Palpations: Abdomen is soft.     Tenderness: There is no abdominal tenderness. There is no guarding.  Musculoskeletal: Normal range of motion.        General: No swelling or tenderness.  Lymphadenopathy:     Cervical: No cervical adenopathy.  Skin:    General: Skin is warm.     Findings: No rash.  Neurological:     General: No focal deficit present.     Mental Status: She is alert.     Sensory: No sensory deficit.  Psychiatric:        Mood and Affect: Mood normal.     ED Treatments / Results  Labs (all labs ordered are listed, but only abnormal results are displayed) Labs Reviewed - No data to display  EKG None  Radiology No results found.  Procedures Procedures (including critical care time)  Medications Ordered in ED Medications - No data to display   Initial Impression / Assessment and Plan / ED Course  I have reviewed the triage vital signs and the nursing notes.  Pertinent labs & imaging results that were available during my care of the patient were reviewed by me and considered in my medical decision making (see chart for details).     Pt is well appearing, non-toxic.  Vitals reassuring.  Patient with likely viral illness.  No  hypoxia, dyspnea, or chest pain. On recheck, she has tolerated p.o. fluids and states she is feeling better and requesting discharge home.  She appears appropriate for discharge home and she is ambulatory with a steady gait.  Agrees to symptomatic treatment and close outpatient follow-up if needed.  Return precautions discussed.  Final Clinical Impressions(s) / ED Diagnoses   Final diagnoses:  Influenza-like illness    ED Discharge Orders    None  Pauline Ausriplett, [place], PA-C 11/07/18 2327    Donnetta Hutchingook, Brian, MD 11/08/18 1734

## 2018-11-07 NOTE — Discharge Instructions (Addendum)
Rest, drink plenty of fluids.  Take Tylenol every 4 hours if needed for fever.  Follow-up with your primary doctor for recheck or return to the ER for any worsening symptoms.

## 2018-11-07 NOTE — ED Notes (Signed)
Pt ambulatory to waiting room. Pt verbalized understanding of discharge instructions.   

## 2018-11-07 NOTE — ED Triage Notes (Signed)
Pt C/O chills and nasal congestion that started this morning. C/O nausea with no vomiting.

## 2018-11-11 ENCOUNTER — Other Ambulatory Visit: Payer: Self-pay | Admitting: Women's Health

## 2019-03-17 ENCOUNTER — Telehealth: Payer: Self-pay | Admitting: Adult Health

## 2019-03-17 ENCOUNTER — Encounter (HOSPITAL_COMMUNITY): Payer: Self-pay

## 2019-03-17 ENCOUNTER — Emergency Department (HOSPITAL_COMMUNITY)
Admission: EM | Admit: 2019-03-17 | Discharge: 2019-03-17 | Disposition: A | Discharge status: Home / Self Care | Payer: Medicaid Other | Attending: Emergency Medicine | Admitting: Emergency Medicine

## 2019-03-17 ENCOUNTER — Other Ambulatory Visit: Payer: Self-pay

## 2019-03-17 DIAGNOSIS — T192XXA Foreign body in vulva and vagina, initial encounter: Secondary | ICD-10-CM | POA: Insufficient documentation

## 2019-03-17 DIAGNOSIS — Y929 Unspecified place or not applicable: Secondary | ICD-10-CM | POA: Insufficient documentation

## 2019-03-17 DIAGNOSIS — J45909 Unspecified asthma, uncomplicated: Secondary | ICD-10-CM | POA: Insufficient documentation

## 2019-03-17 DIAGNOSIS — Y939 Activity, unspecified: Secondary | ICD-10-CM | POA: Insufficient documentation

## 2019-03-17 DIAGNOSIS — X58XXXA Exposure to other specified factors, initial encounter: Secondary | ICD-10-CM | POA: Insufficient documentation

## 2019-03-17 DIAGNOSIS — Y999 Unspecified external cause status: Secondary | ICD-10-CM | POA: Insufficient documentation

## 2019-03-17 NOTE — Telephone Encounter (Signed)
Pt has a tampon stuck and cant find string does she need to make appointment

## 2019-03-17 NOTE — ED Provider Notes (Signed)
[facility] EMERGENCY [facility] Provider Note   CSN: [id number] Arrival date & time: 03/17/19  [id number]    History   Chief Complaint Chief Complaint  Patient presents with  . Foreign Body in Vagina    HPI Diane Parks is a 28 y.o. female with past medical history as outlined below, presenting with a tampon in her vagina she has been unable to reach this morning. She had "unexpected sex" last night, forgetting the tampon was in place.  She denies vaginal pain or any other concerns.     The history is provided by the patient.    Past Medical History:  Diagnosis Date  . Asthma   . BV (bacterial vaginosis) 06/20/2015  . Chlamydia   . Hidradenitis   . History of chlamydia 07/20/2015  . Missed period 12/06/2015  . Obesity   . PCB (post coital bleeding) 12/06/2015  . UTI (lower urinary tract infection) 06/20/2015  . Vaginal discharge 06/20/2015    Patient Active Problem List   Diagnosis Date Noted  . Encounter for initial prescription of injectable contraceptive 06/05/2017  . Encounter for gynecological examination with Papanicolaou smear of cervix 06/05/2017  . PCB (post coital bleeding) 12/06/2015  . Missed period 12/06/2015  . History of chlamydia 07/20/2015  . Vaginal discharge 06/20/2015  . BV (bacterial vaginosis) 06/20/2015  . UTI (lower urinary tract infection) 06/20/2015  . Hidradenitis 03/15/2015  . Obesity 03/07/2014  . Absence of menstruation 02/18/2013    Past Surgical History:  Procedure Laterality Date  . EYE SURGERY    . EYE SURGERY       OB History    Gravida  0   Para      Term      Preterm      AB      Living        SAB      TAB      Ectopic      Multiple      Live Births               Home Medications    Prior to Admission medications   Medication Sig Start Date End Date Taking? Authorizing Provider  ibuprofen (ADVIL,MOTRIN) 800 MG tablet Take 1 tablet (800 mg total) by mouth 3 (three) times daily. 11/07/18   Triplett,  Tammy, PA-C  promethazine-dextromethorphan (PROMETHAZINE-DM) 6.25-15 MG/5ML syrup Take 5 mLs by mouth 4 (four) times daily as needed. 11/07/18   Pauline Ausriplett, Tammy, PA-C    Family History Family History  Problem Relation Age of Onset  . Cancer Maternal Grandmother   . Cancer Maternal Grandfather   . Cancer Paternal Grandmother   . Cancer Paternal Grandfather   . Hypertension Mother   . Heart disease Mother     Social History Social History   Tobacco Use  . Smoking status: Never Smoker  . Smokeless tobacco: Never Used  Substance Use Topics  . Alcohol use: No  . Drug use: No     Allergies   Patient has no known allergies.   Review of Systems Review of Systems  Constitutional: Negative for chills and fever.  Eyes: Negative.   Gastrointestinal: Negative for abdominal pain, nausea and vomiting.  Genitourinary: Negative.  Negative for genital sores, pelvic pain, vaginal discharge and vaginal pain.  Skin: Negative for rash and wound.  Psychiatric/Behavioral: Negative.      Physical Exam Updated Vital Signs BP 121/67 (BP Location: Left Arm)   Pulse 78   Temp 98.2 F (  36.8 C) (Oral)   Resp 16   Ht 5\' 1"  (1.549 m)   Wt 81.6 kg   LMP 03/16/2019   SpO2 100%   BMI 34.01 kg/m   Physical Exam Vitals signs and nursing note reviewed. Exam conducted with a chaperone present.  Constitutional:      Appearance: She is well-developed.  HENT:     Head: Normocephalic.  Eyes:     Conjunctiva/sclera: Conjunctivae normal.  Cardiovascular:     Rate and Rhythm: Normal rate.  Pulmonary:     Effort: Pulmonary effort is normal.  Abdominal:     General: Bowel sounds are normal. There is no distension.     Palpations: Abdomen is soft.     Tenderness: There is no abdominal tenderness.  Genitourinary:    Vagina: Foreign body present.     Comments: Tampon lying in the posterior fornix horizontally. Musculoskeletal: Normal range of motion.  Skin:    General: Skin is warm and dry.   Neurological:     Mental Status: She is alert.      ED Treatments / Results  Labs (all labs ordered are listed, but only abnormal results are displayed) Labs Reviewed - No data to display  EKG None  Radiology No results found.  Procedures .Foreign Body Removal Date/Time: 03/17/2019 10:14 AM Performed by: Burgess Amor, PA-C Authorized by: Burgess Amor, PA-C  Consent: Verbal consent obtained. Risks and benefits: risks, benefits and alternatives were discussed Consent given by: patient Patient understanding: patient states understanding of the procedure being performed Patient identity confirmed: verbally with patient Body area: vagina  Sedation: Patient sedated: no  Localization method: speculum Removal mechanism: ring forceps Complexity: simple 1 objects recovered. Objects recovered: tampon Post-procedure assessment: foreign body removed Patient tolerance: Patient tolerated the procedure well with no immediate complications   (including critical care time)  .edforei  Medications Ordered in ED Medications - No data to display   Initial Impression / Assessment and Plan / ED Course  I have reviewed the triage vital signs and the nursing notes.  Pertinent labs & imaging results that were available during my care of the patient were reviewed by me and considered in my medical decision making (see chart for details).        Prn f/u anticipated  Final Clinical Impressions(s) / ED Diagnoses   Final diagnoses:  Foreign body in vagina, initial encounter    ED Discharge Orders    None       Victoriano Lain 03/17/19 1018    Samuel Jester, DO 03/22/19 [phone]

## 2019-03-17 NOTE — ED Triage Notes (Signed)
Pt has a tampon stuck in her vagina. States she is unable to find the string. Pt noticed she could not get to it this morning. No pain.

## 2019-03-17 NOTE — Telephone Encounter (Signed)
Pt went to ER and got tampon removed. [facility]

## 2019-08-25 ENCOUNTER — Emergency Department (HOSPITAL_COMMUNITY)
Admission: EM | Admit: 2019-08-25 | Discharge: 2019-08-26 | Disposition: A | Discharge status: Home / Self Care | Payer: Self-pay | Attending: Emergency Medicine | Admitting: Emergency Medicine

## 2019-08-25 ENCOUNTER — Other Ambulatory Visit: Payer: Self-pay

## 2019-08-25 ENCOUNTER — Encounter (HOSPITAL_COMMUNITY): Payer: Self-pay | Admitting: Emergency Medicine

## 2019-08-25 DIAGNOSIS — J029 Acute pharyngitis, unspecified: Secondary | ICD-10-CM

## 2019-08-25 DIAGNOSIS — J45909 Unspecified asthma, uncomplicated: Secondary | ICD-10-CM | POA: Insufficient documentation

## 2019-08-25 MED ORDER — SODIUM CHLORIDE 0.9 % IV BOLUS
1000.0000 mL | Freq: Once | INTRAVENOUS | Status: AC
  Administered 2019-08-26: 01:00:00 1000 mL via INTRAVENOUS

## 2019-08-25 MED ORDER — CLINDAMYCIN PHOSPHATE 600 MG/50ML IV SOLN
600.0000 mg | Freq: Once | INTRAVENOUS | Status: AC
  Administered 2019-08-26: 600 mg via INTRAVENOUS
  Filled 2019-08-25: qty 50

## 2019-08-25 MED ORDER — DEXAMETHASONE SODIUM PHOSPHATE 10 MG/ML IJ SOLN
10.0000 mg | Freq: Once | INTRAMUSCULAR | Status: AC
  Administered 2019-08-26: 01:00:00 10 mg via INTRAVENOUS
  Filled 2019-08-25: qty 1

## 2019-08-25 NOTE — ED Provider Notes (Signed)
[facility] EMERGENCY DEPARTMENT Provider Note   CSN: [id number] Arrival date & time: 08/25/19  1930     History   Chief Complaint Chief Complaint  Patient presents with  . Fever    HPI Diane Parks is a 28 y.o. female.     HPI  Diane Parks is a 28 y.o. female, with a history of asthma and obesity, presenting to the ED with sore throat beginning yesterday.  Pain is unilateral on the right side, sharp and throbbing, moderate to severe, nonradiating.  Accompanied by subjective fever, chills, and body aches.  Denies vomiting, diarrhea, chest pain, cough, difficulty breathing, drooling, or any other complaints.     Past Medical History:  Diagnosis Date  . Asthma   . BV (bacterial vaginosis) 06/20/2015  . Chlamydia   . Hidradenitis   . History of chlamydia 07/20/2015  . Missed period 12/06/2015  . Obesity   . PCB (post coital bleeding) 12/06/2015  . UTI (lower urinary tract infection) 06/20/2015  . Vaginal discharge 06/20/2015    Patient Active Problem List   Diagnosis Date Noted  . Encounter for initial prescription of injectable contraceptive 06/05/2017  . Encounter for gynecological examination with Papanicolaou smear of cervix 06/05/2017  . PCB (post coital bleeding) 12/06/2015  . Missed period 12/06/2015  . History of chlamydia 07/20/2015  . Vaginal discharge 06/20/2015  . BV (bacterial vaginosis) 06/20/2015  . UTI (lower urinary tract infection) 06/20/2015  . Hidradenitis 03/15/2015  . Obesity 03/07/2014  . Absence of menstruation 02/18/2013    Past Surgical History:  Procedure Laterality Date  . EYE SURGERY    . EYE SURGERY       OB History    Gravida  0   Para      Term      Preterm      AB      Living        SAB      TAB      Ectopic      Multiple      Live Births               Home Medications    Prior to Admission medications   Medication Sig Start Date End Date Taking? Authorizing Provider  clindamycin (CLEOCIN)  300 MG capsule Take 1 capsule (300 mg total) by mouth 3 (three) times daily for 14 days. 08/26/19 09/09/19  Joy, Shawn C, PA-C  ondansetron (ZOFRAN ODT) 4 MG disintegrating tablet Take 1 tablet (4 mg total) by mouth every 8 (eight) hours as needed for nausea or vomiting. 08/26/19   Joy, Hillard DankerShawn C, PA-C    Family History Family History  Problem Relation Age of Onset  . Cancer Maternal Grandmother   . Cancer Maternal Grandfather   . Cancer Paternal Grandmother   . Cancer Paternal Grandfather   . Hypertension Mother   . Heart disease Mother     Social History Social History   Tobacco Use  . Smoking status: Never Smoker  . Smokeless tobacco: Never Used  Substance Use Topics  . Alcohol use: No  . Drug use: No     Allergies   Patient has no known allergies.   Review of Systems Review of Systems  Constitutional: Positive for chills and fever.  HENT: Positive for sore throat and voice change. Negative for drooling and trouble swallowing.   Respiratory: Negative for cough, shortness of breath, wheezing and stridor.   Cardiovascular: Negative for chest pain.  Gastrointestinal: Negative  for abdominal pain, diarrhea, nausea and vomiting.  Musculoskeletal: Negative for neck pain and neck stiffness.  Neurological: Negative for dizziness.  All other systems reviewed and are negative.    Physical Exam Updated Vital Signs BP 139/71   Pulse (!) 109   Temp 99.5 F (37.5 C)   Resp 20   Ht 5\' 1"  (1.549 m)   Wt 77.1 kg   SpO2 97%   BMI 32.12 kg/m   Physical Exam Vitals signs and nursing note reviewed.  Constitutional:      General: She is not in acute distress.    Appearance: She is well-developed. She is not diaphoretic.  HENT:     Head: Normocephalic and atraumatic.     Mouth/Throat:     Mouth: Mucous membranes are moist.     Pharynx: Pharyngeal swelling present.     Comments: Peritonsillar swelling, right worse than left.  Uvula midline.  Handles oral secretions without  difficulty.  No tripoding. Some muffling to the patient's voice, however, patient does not appear to have any difficulty speaking. Eyes:     Conjunctiva/sclera: Conjunctivae normal.  Neck:     Musculoskeletal: Neck supple.  Cardiovascular:     Rate and Rhythm: Normal rate and regular rhythm.     Pulses: Normal pulses.          Radial pulses are 2+ on the right side and 2+ on the left side.       Posterior tibial pulses are 2+ on the right side and 2+ on the left side.     Comments: Tactile temperature in the extremities appropriate and equal bilaterally. Pulmonary:     Effort: Pulmonary effort is normal. No respiratory distress.     Breath sounds: Normal breath sounds.  Abdominal:     Tenderness: There is no guarding.  Musculoskeletal:     Right lower leg: No edema.     Left lower leg: No edema.  Lymphadenopathy:     Cervical: No cervical adenopathy.  Skin:    General: Skin is warm and dry.  Neurological:     Mental Status: She is alert.  Psychiatric:        Mood and Affect: Mood and affect normal.        Speech: Speech normal.        Behavior: Behavior normal.      ED Treatments / Results  Labs (all labs ordered are listed, but only abnormal results are displayed) Labs Reviewed  BASIC METABOLIC PANEL - Abnormal; Notable for the following components:      Result Value   Glucose, Bld 102 (*)    Calcium 8.7 (*)    All other components within normal limits  CBC WITH DIFFERENTIAL/PLATELET - Abnormal; Notable for the following components:   WBC 16.7 (*)    Neutro Abs 9.6 (*)    Lymphs Abs 5.1 (*)    Monocytes Absolute 1.7 (*)    All other components within normal limits  GROUP A STREP BY PCR  POC URINE PREG, ED    EKG None  Radiology No results found.  Procedures Procedures (including critical care time)  Medications Ordered in ED Medications  sodium chloride 0.9 % bolus 1,000 mL (1,000 mLs Intravenous New Bag/Given 08/26/19 0041)  clindamycin (CLEOCIN)  IVPB 600 mg (0 mg Intravenous Stopped 08/26/19 0118)  dexamethasone (DECADRON) injection 10 mg (10 mg Intravenous Given 08/26/19 0042)  ketorolac (TORADOL) 30 MG/ML injection 15 mg (15 mg Intravenous Given 08/26/19 0041)  iohexol (OMNIPAQUE)  300 MG/ML solution 75 mL (75 mLs Intravenous Contrast Given 08/26/19 0137)     Initial Impression / Assessment and Plan / ED Course  I have reviewed the triage vital signs and the nursing notes.  Pertinent labs & imaging results that were available during my care of the patient were reviewed by me and considered in my medical decision making (see chart for details).        Patient presents with unilateral sore throat concerning for possible peritonsillar abscess.  She does not show signs of emergent airway obstruction.  Low suspicion for sepsis.   She showed improvement with interventions ordered here in the ED.  Upon the end of my shift, patient care handoff report given to Dr. Rolland Porter. She will follow up on CT and disposition patient.   Final Clinical Impressions(s) / ED Diagnoses   Final diagnoses:  Sore throat    ED Discharge Orders         Ordered    clindamycin (CLEOCIN) 300 MG capsule  3 times daily     08/26/19 0208    ondansetron (ZOFRAN ODT) 4 MG disintegrating tablet  Every 8 hours PRN     08/26/19 0208           Lorayne Bender, PA-C 08/26/19 Kallie Locks, MD 08/26/19 [phone]

## 2019-08-25 NOTE — ED Triage Notes (Signed)
Pt c/o fever, sore throat, body aches and chills since yesterday.

## 2019-08-26 ENCOUNTER — Emergency Department (HOSPITAL_COMMUNITY): Payer: Self-pay

## 2019-08-26 LAB — BASIC METABOLIC PANEL
Anion gap: 8 (ref 5–15)
BUN: 9 mg/dL (ref 6–20)
CO2: 25 mmol/L (ref 22–32)
Calcium: 8.7 mg/dL — ABNORMAL LOW (ref 8.9–10.3)
Chloride: 102 mmol/L (ref 98–111)
Creatinine, Ser: 0.73 mg/dL (ref 0.44–1.00)
GFR calc Af Amer: >60 mL/min (ref >60)
GFR calc non Af Amer: >60 mL/min (ref >60)
Glucose, Bld: 102 mg/dL — ABNORMAL HIGH (ref 70–99)
Potassium: 3.6 mmol/L (ref 3.5–5.1)
Sodium: 135 mmol/L (ref 135–145)

## 2019-08-26 LAB — CBC WITH DIFFERENTIAL/PLATELET
Abs Immature Granulocytes: 0.05 10*3/uL (ref 0.00–0.07)
Basophils Absolute: 0.1 10*3/uL (ref 0.0–0.1)
Basophils Relative: 0 %
Eosinophils Absolute: 0.1 10*3/uL (ref 0.0–0.5)
Eosinophils Relative: 1 %
HCT: 41.9 % (ref 36.0–46.0)
Hemoglobin: 13 g/dL (ref 12.0–15.0)
Immature Granulocytes: 0 %
Lymphocytes Relative: 31 %
Lymphs Abs: 5.1 10*3/uL — ABNORMAL HIGH (ref 0.7–4.0)
MCH: 26.8 pg (ref 26.0–34.0)
MCHC: 31 g/dL (ref 30.0–36.0)
MCV: 86.4 fL (ref 80.0–100.0)
Monocytes Absolute: 1.7 10*3/uL — ABNORMAL HIGH (ref 0.1–1.0)
Monocytes Relative: 10 %
Neutro Abs: 9.6 10*3/uL — ABNORMAL HIGH (ref 1.7–7.7)
Neutrophils Relative %: 58 %
Platelets: 325 10*3/uL (ref 150–400)
RBC: 4.85 MIL/uL (ref 3.87–5.11)
RDW: 13.2 % (ref 11.5–15.5)
WBC: 16.7 10*3/uL — ABNORMAL HIGH (ref 4.0–10.5)
nRBC: 0 % (ref 0.0–0.2)

## 2019-08-26 LAB — POC URINE PREG, ED: Preg Test, Ur: NEGATIVE

## 2019-08-26 LAB — GROUP A STREP BY PCR: Group A Strep by PCR: NOT DETECTED

## 2019-08-26 MED ORDER — KETOROLAC TROMETHAMINE 30 MG/ML IJ SOLN
15.0000 mg | Freq: Once | INTRAMUSCULAR | Status: AC
  Administered 2019-08-26: 01:00:00 15 mg via INTRAVENOUS
  Filled 2019-08-26: qty 1

## 2019-08-26 MED ORDER — IOHEXOL 300 MG/ML  SOLN
75.0000 mL | Freq: Once | INTRAMUSCULAR | Status: AC | PRN
  Administered 2019-08-26: 02:00:00 75 mL via INTRAVENOUS

## 2019-08-26 MED ORDER — ONDANSETRON 4 MG PO TBDP: 4.0000 mg | ORAL_TABLET | Freq: Three times a day (TID) | ORAL | 0 refills | Status: DC | PRN

## 2019-08-26 MED ORDER — CLINDAMYCIN HCL 300 MG PO CAPS: 300.0000 mg | ORAL_CAPSULE | Freq: Three times a day (TID) | ORAL | 0 refills | Status: AC

## 2019-08-26 NOTE — Discharge Instructions (Addendum)
Sore Throat   Hand washing: Wash your hands throughout the day, but especially before and after touching the face, using the restroom, sneezing, coughing, or touching surfaces that have been coughed or sneezed upon. Hydration: Symptoms will be intensified and complicated by dehydration. Dehydration can also extend the duration of symptoms. Drink plenty of fluids and get plenty of rest. You should be drinking at least half a liter of water an hour to stay hydrated. Electrolyte drinks (ex. Gatorade, Powerade, Pedialyte) are also encouraged. You should be drinking enough fluids to make your urine light yellow, almost clear. If this is not the case, you are not drinking enough water. Please note that some of the treatments indicated below will not be effective if you are not adequately hydrated. Diet: Please concentrate on hydration, however, you may introduce food slowly.  Start with a clear liquid diet, progressed to a full liquid diet, and then bland solids as you are able. Please take all of your antibiotics until finished!   You may develop abdominal discomfort or diarrhea from the antibiotic.  You may help offset this with probiotics which you can buy or get in yogurt. Do not eat or take the probiotics until 2 hours after your antibiotic.  Pain or fever: Ibuprofen, Naproxen, or Tylenol for pain or fever. (see below for suggested regimen) Antiinflammatory medications: Take 600 mg of ibuprofen every 6 hours or 440 mg (over the counter dose) to 500 mg (prescription dose) of naproxen every 12 hours for the next 3 days. After this time, these medications may be used as needed for pain. Take these medications with food to avoid upset stomach. Choose only one of these medications, do not take them together. Tylenol: Should you continue to have additional pain while taking the ibuprofen or naproxen, you may add in tylenol as needed. Your daily total maximum amount of tylenol from all sources should be limited to  4000mg /day for persons without liver problems, or 2000mg /day for those with liver problems. Sore throat: Warm liquids or Chloraseptic spray may help soothe a sore throat. Gargle twice a day with a salt water solution made from a half teaspoon of salt in a cup of warm water.  Follow up: Follow up with the ear nose and throat specialist on this matter.  Call as soon as possible to make an appointment. Return: Return to the emergency department for difficulty breathing, difficulty swallowing causing drooling, persistent vomiting, passing out, or any other major concerns.  For prescription assistance, may try using prescription discount sites or apps, such as goodrx.com

## 2019-08-26 NOTE — ED Notes (Signed)
CRITICAL VALUE ALERT  Critical Value: Glucose 741 Date & Time Notied:  08/26/2019 @ 1829 Provider Notified: Dr Eliane Decree Orders Received/Actions taken: None yet

## 2019-08-28 ENCOUNTER — Emergency Department (HOSPITAL_COMMUNITY)
Admission: EM | Admit: 2019-08-28 | Discharge: 2019-08-28 | Disposition: A | Discharge status: Home / Self Care | Payer: Medicaid Other | Attending: Emergency Medicine | Admitting: Emergency Medicine

## 2019-08-28 ENCOUNTER — Encounter (HOSPITAL_COMMUNITY): Payer: Self-pay | Admitting: Emergency Medicine

## 2019-08-28 ENCOUNTER — Other Ambulatory Visit: Payer: Self-pay

## 2019-08-28 DIAGNOSIS — J029 Acute pharyngitis, unspecified: Secondary | ICD-10-CM

## 2019-08-28 DIAGNOSIS — J039 Acute tonsillitis, unspecified: Secondary | ICD-10-CM | POA: Insufficient documentation

## 2019-08-28 DIAGNOSIS — Z20828 Contact with and (suspected) exposure to other viral communicable diseases: Secondary | ICD-10-CM | POA: Insufficient documentation

## 2019-08-28 DIAGNOSIS — J45909 Unspecified asthma, uncomplicated: Secondary | ICD-10-CM | POA: Insufficient documentation

## 2019-08-28 LAB — BASIC METABOLIC PANEL
Anion gap: 9 (ref 5–15)
BUN: 12 mg/dL (ref 6–20)
CO2: 27 mmol/L (ref 22–32)
Calcium: 8.9 mg/dL (ref 8.9–10.3)
Chloride: 102 mmol/L (ref 98–111)
Creatinine, Ser: 0.62 mg/dL (ref 0.44–1.00)
GFR calc Af Amer: >60 mL/min (ref >60)
GFR calc non Af Amer: >60 mL/min (ref >60)
Glucose, Bld: 82 mg/dL (ref 70–99)
Potassium: 3.8 mmol/L (ref 3.5–5.1)
Sodium: 138 mmol/L (ref 135–145)

## 2019-08-28 LAB — CBC WITH DIFFERENTIAL/PLATELET
Abs Immature Granulocytes: 0.04 10*3/uL (ref 0.00–0.07)
Basophils Absolute: 0.1 10*3/uL (ref 0.0–0.1)
Basophils Relative: 0 %
Eosinophils Absolute: 0.2 10*3/uL (ref 0.0–0.5)
Eosinophils Relative: 2 %
HCT: 42.9 % (ref 36.0–46.0)
Hemoglobin: 13.6 g/dL (ref 12.0–15.0)
Immature Granulocytes: 0 %
Lymphocytes Relative: 34 %
Lymphs Abs: 3.9 10*3/uL (ref 0.7–4.0)
MCH: 27.4 pg (ref 26.0–34.0)
MCHC: 31.7 g/dL (ref 30.0–36.0)
MCV: 86.5 fL (ref 80.0–100.0)
Monocytes Absolute: 1 10*3/uL (ref 0.1–1.0)
Monocytes Relative: 9 %
Neutro Abs: 6.2 10*3/uL (ref 1.7–7.7)
Neutrophils Relative %: 55 %
Platelets: 363 10*3/uL (ref 150–400)
RBC: 4.96 MIL/uL (ref 3.87–5.11)
RDW: 13.1 % (ref 11.5–15.5)
WBC: 11.4 10*3/uL — ABNORMAL HIGH (ref 4.0–10.5)
nRBC: 0 % (ref 0.0–0.2)

## 2019-08-28 MED ORDER — AMOXICILLIN 250 MG PO CAPS
500.0000 mg | ORAL_CAPSULE | Freq: Once | ORAL | Status: AC
  Administered 2019-08-28: 500 mg via ORAL
  Filled 2019-08-28: qty 2

## 2019-08-28 MED ORDER — AMOXICILLIN 500 MG PO CAPS: 500.0000 mg | ORAL_CAPSULE | Freq: Three times a day (TID) | ORAL | 0 refills | Status: DC

## 2019-08-28 NOTE — ED Provider Notes (Signed)
[facility] EMERGENCY DEPARTMENT Provider Note   CSN: [id number] Arrival date & time: 08/28/19  1623     History   Chief Complaint Chief Complaint  Patient presents with  . Sore Throat    HPI Diane Parks is a 28 y.o. female.     Patient is a 28 year old female who presents to the emergency department for recheck of her sore throat.  The patient states she was seen in the emergency department on October 21.  At that time she had a severe sore throat, body aches and generally did not feel well.  There was question as to whether or not she had a tonsillar abscess.  CT scan confirmed that she did not have a tonsillar abscess.  She was placed on clindamycin.  The patient states that she could not take the medication because she could not afford the medication.  She returns tonight to have this rechecked.  The patient also states that because she had fever and headache and sore throat a few days ago that she would need a Covid test and documentation that she can safely return to work.  The history is provided by the patient.  Sore Throat Pertinent negatives include no chest pain, no abdominal pain and no shortness of breath.    Past Medical History:  Diagnosis Date  . Asthma   . BV (bacterial vaginosis) 06/20/2015  . Chlamydia   . Hidradenitis   . History of chlamydia 07/20/2015  . Missed period 12/06/2015  . Obesity   . PCB (post coital bleeding) 12/06/2015  . UTI (lower urinary tract infection) 06/20/2015  . Vaginal discharge 06/20/2015    Patient Active Problem List   Diagnosis Date Noted  . Encounter for initial prescription of injectable contraceptive 06/05/2017  . Encounter for gynecological examination with Papanicolaou smear of cervix 06/05/2017  . PCB (post coital bleeding) 12/06/2015  . Missed period 12/06/2015  . History of chlamydia 07/20/2015  . Vaginal discharge 06/20/2015  . BV (bacterial vaginosis) 06/20/2015  . UTI (lower urinary tract infection) 06/20/2015   . Hidradenitis 03/15/2015  . Obesity 03/07/2014  . Absence of menstruation 02/18/2013    Past Surgical History:  Procedure Laterality Date  . EYE SURGERY    . EYE SURGERY       OB History    Gravida  0   Para      Term      Preterm      AB      Living        SAB      TAB      Ectopic      Multiple      Live Births               Home Medications    Prior to Admission medications   Medication Sig Start Date End Date Taking? Authorizing Provider  clindamycin (CLEOCIN) 300 MG capsule Take 1 capsule (300 mg total) by mouth 3 (three) times daily for 14 days. Patient not taking: Reported on 08/28/2019 08/26/19 09/09/19  Joy, Shawn C, PA-C  ondansetron (ZOFRAN ODT) 4 MG disintegrating tablet Take 1 tablet (4 mg total) by mouth every 8 (eight) hours as needed for nausea or vomiting. Patient not taking: Reported on 08/28/2019 08/26/19   Anselm PancoastJoy, Shawn C, PA-C    Family History Family History  Problem Relation Age of Onset  . Cancer Maternal Grandmother   . Cancer Maternal Grandfather   . Cancer Paternal Grandmother   .  Cancer Paternal Grandfather   . Hypertension Mother   . Heart disease Mother     Social History Social History   Tobacco Use  . Smoking status: Never Smoker  . Smokeless tobacco: Never Used  Substance Use Topics  . Alcohol use: No  . Drug use: No     Allergies   Patient has no known allergies.   Review of Systems Review of Systems  Constitutional: Positive for fatigue. Negative for activity change, appetite change and fever.  HENT: Positive for sore throat. Negative for congestion, ear discharge, ear pain, facial swelling, nosebleeds, rhinorrhea, sneezing and tinnitus.   Eyes: Negative for photophobia, pain and discharge.  Respiratory: Negative for cough, choking, shortness of breath and wheezing.   Cardiovascular: Negative for chest pain, palpitations and leg swelling.  Gastrointestinal: Negative for abdominal pain, blood in  stool, constipation, diarrhea, nausea and vomiting.  Genitourinary: Negative for difficulty urinating, dysuria, flank pain, frequency and hematuria.  Musculoskeletal: Negative for back pain, gait problem, myalgias and neck pain.  Skin: Negative for color change, rash and wound.  Neurological: Negative for dizziness, seizures, syncope, facial asymmetry, speech difficulty, weakness and numbness.  Hematological: Negative for adenopathy. Does not bruise/bleed easily.  Psychiatric/Behavioral: Negative for agitation, confusion, hallucinations, self-injury and suicidal ideas. The patient is not nervous/anxious.      Physical Exam Updated Vital Signs BP 113/88 (BP Location: Right Arm)   Pulse 83   Temp 98.2 F (36.8 C) (Oral)   Resp 18   Ht 5\' 1"  (1.549 m)   Wt 81.6 kg   LMP 08/05/2019   SpO2 96%   BMI 34.01 kg/m   Physical Exam Vitals signs and nursing note reviewed.  Constitutional:      Appearance: She is well-developed. She is not toxic-appearing.  HENT:     Head: Normocephalic.     Right Ear: Tympanic membrane and external ear normal.     Left Ear: Tympanic membrane and external ear normal.     Mouth/Throat:     Pharynx: Uvula midline. Posterior oropharyngeal erythema present.     Tonsils: No tonsillar abscesses.  Eyes:     General: Lids are normal.     Pupils: Pupils are equal, round, and reactive to light.  Neck:     Musculoskeletal: Normal range of motion and neck supple.     Vascular: No carotid bruit.  Cardiovascular:     Rate and Rhythm: Normal rate and regular rhythm.     Pulses: Normal pulses.     Heart sounds: Normal heart sounds.  Pulmonary:     Effort: No respiratory distress.     Breath sounds: Normal breath sounds.     Comments: The uvula is in the midline.  The airway is patent.  The right tonsil is enlarged.  There is resolving exudate present.  There is some increased redness of the posterior pharynx.  Speech is clear. Abdominal:     General: Bowel  sounds are normal.     Palpations: Abdomen is soft.     Tenderness: There is no abdominal tenderness. There is no guarding.  Musculoskeletal: Normal range of motion.  Lymphadenopathy:     Head:     Right side of head: No submandibular adenopathy.     Left side of head: No submandibular adenopathy.     Cervical: No cervical adenopathy.  Skin:    General: Skin is warm and dry.  Neurological:     Mental Status: She is alert and oriented to person, place,  and time.     Cranial Nerves: No cranial nerve deficit.     Sensory: No sensory deficit.  Psychiatric:        Speech: Speech normal.      ED Treatments / Results  Labs (all labs ordered are listed, but only abnormal results are displayed) Labs Reviewed  SARS CORONAVIRUS 2 (TAT 6-24 HRS)  CBC WITH DIFFERENTIAL/PLATELET  BASIC METABOLIC PANEL    EKG None  Radiology No results found.  Procedures Procedures (including critical care time)  Medications Ordered in ED Medications - No data to display   Initial Impression / Assessment and Plan / ED Course  I have reviewed the triage vital signs and the nursing notes.  Pertinent labs & imaging results that were available during my care of the patient were reviewed by me and considered in my medical decision making (see chart for details).          Final Clinical Impressions(s) / ED Diagnoses  MDM  Patient states she continues to have some soreness of her throat.  She is able to swallow liquids as well as solid foods.  It has improved from her visit on October 21, but has not resolved.  The patient states that her fever has broken.  She presents for recheck, as she was unable to afford the antibiotics that were given to her.  Vital signs within normal limits.  The airway is patent.  The speech is understandable.  Complete blood count was obtained.  The white blood cells have improved from 16,002 11,000. Basic metabolic panel is within normal limits. Patient drinking  fluids in the emergency department without problem.  COVID-19 test was obtained.  Have asked the patient to quarantine herself until these results return.  Patient placed on Amoxil.  I have asked the patient to use Chloraseptic spray and salt water gargles to assist with her pain and discomfort.  I reminded her the importance of using her mask.  Washing her hands frequently, and using adequate distancing.  Patient is in agreement with this plan.   Final diagnoses:  Tonsillitis  Pharyngitis, unspecified etiology    ED Discharge Orders         Ordered    amoxicillin (AMOXIL) 500 MG capsule  3 times daily     08/28/19 1901           Ivery Quale, Cordelia Poche 08/28/19 1936    Bethann Berkshire, MD 08/29/19 2016

## 2019-08-28 NOTE — ED Triage Notes (Signed)
Patient seen here on 08/25/2019 and diagnosed with pharyngitis. Patient unable to afford antibiotic prescribed and is getting worse. Patient also needs note for work related to covid symptoms. Denies any fevers.

## 2019-08-28 NOTE — Discharge Instructions (Signed)
There is mild increased swelling of your right tonsil.  There is increased redness of the back of your throat.  Your vital signs are within normal limits.  Your oxygen level is within normal limits.  Your complete blood count shows that your white blood cell count is improving significantly.  A COVID-19 test has been obtained.  Please quarantine yourself until these test results return.  Please use salt water gargles, and Chloraseptic spray for discomfort.  Please use your mask.  Please wash hands frequently.  Maintain adequate distancing.  Use Amoxil 3 times daily with food.

## 2019-08-29 LAB — SARS CORONAVIRUS 2 (TAT 6-24 HRS): SARS Coronavirus 2: NEGATIVE

## 2019-10-20 ENCOUNTER — Other Ambulatory Visit: Payer: Medicaid Other | Admitting: Adult Health

## 2019-11-30 ENCOUNTER — Encounter: Payer: Self-pay | Admitting: Adult Health

## 2019-11-30 ENCOUNTER — Ambulatory Visit (INDEPENDENT_AMBULATORY_CARE_PROVIDER_SITE_OTHER): Payer: Self-pay | Admitting: Adult Health

## 2019-11-30 ENCOUNTER — Other Ambulatory Visit: Payer: Self-pay

## 2019-11-30 VITALS — BP 116/76 | HR 72 | Ht 61.0 in | Wt 210.0 lb

## 2019-11-30 DIAGNOSIS — N926 Irregular menstruation, unspecified: Secondary | ICD-10-CM | POA: Insufficient documentation

## 2019-11-30 DIAGNOSIS — Z3202 Encounter for pregnancy test, result negative: Secondary | ICD-10-CM

## 2019-11-30 LAB — POCT URINE PREGNANCY: Preg Test, Ur: NEGATIVE

## 2019-11-30 MED ORDER — MEDROXYPROGESTERONE ACETATE 10 MG PO TABS: ORAL_TABLET | ORAL | 0 refills | Status: DC

## 2019-11-30 NOTE — Progress Notes (Signed)
  Subjective:     Patient ID: Diane Parks, female   DOB: 1991/01/16, 29 y.o.   MRN: [id number]  HPI Diane Parks is a 29 year old white female, single, G0P0, in complaining of irregular periods.Last period was in October she thinks and was regular before that. She says had normal pap in November.   Review of Systems Has missed several periods since October, was regular before that Would like to be pregnant,no problems with sex  Reviewed past medical,surgical, social and family history. Reviewed medications and allergies.     Objective:   Physical Exam BP 116/76 (BP Location: Left Arm, Patient Position: Sitting, Cuff Size: Normal)   Pulse 72   Ht 5\' 1"  (1.549 m)   Wt 210 lb (95.3 kg)   LMP  (Approximate) Comment: had a period a few months ago  BMI 39.68 kg/m  UPT is negative. Skin warm and dry. Neck: mid line trachea, normal thyroid, good ROM, no lymphadenopathy noted. Lungs: clear to ausculation bilaterally. Cardiovascular: regular rate and rhythm.   She declines STD testing.  Assessment:     1. Irregular periods Discussed taking provera to get withdrawal bleeding then cycling for about 3 months on OCs to see if periods regular again  2. Missed periods Will rx provera to to see can get a withdrawal Meds ordered this encounter  Medications  . medroxyPROGESTERone (PROVERA) 10 MG tablet    Sig: Take 1 daily for 10 days    Dispense:  10 tablet    Refill:  0    Order Specific Question:   Supervising Provider    Answer:   [[id number]]  Will see back 12/16/19 and start OCs then    Plan:     Follow up 12/16/19

## 2019-12-17 ENCOUNTER — Ambulatory Visit (INDEPENDENT_AMBULATORY_CARE_PROVIDER_SITE_OTHER): Payer: Medicaid Other | Admitting: Adult Health

## 2019-12-17 ENCOUNTER — Other Ambulatory Visit: Payer: Self-pay

## 2019-12-17 ENCOUNTER — Encounter: Payer: Self-pay | Admitting: Adult Health

## 2019-12-17 VITALS — BP 110/68 | HR 74 | Ht 61.0 in | Wt 208.5 lb

## 2019-12-17 DIAGNOSIS — Z7689 Persons encountering health services in other specified circumstances: Secondary | ICD-10-CM

## 2019-12-17 DIAGNOSIS — Z3202 Encounter for pregnancy test, result negative: Secondary | ICD-10-CM | POA: Diagnosis not present

## 2019-12-17 DIAGNOSIS — Z30011 Encounter for initial prescription of contraceptive pills: Secondary | ICD-10-CM

## 2019-12-17 DIAGNOSIS — N926 Irregular menstruation, unspecified: Secondary | ICD-10-CM

## 2019-12-17 LAB — POCT URINE PREGNANCY: Preg Test, Ur: NEGATIVE

## 2019-12-17 MED ORDER — NORETHIN ACE-ETH ESTRAD-FE 1-20 MG-MCG(24) PO CAPS: 1.0000 | ORAL_CAPSULE | Freq: Every day | ORAL | 3 refills | Status: DC

## 2019-12-17 NOTE — Progress Notes (Signed)
  Subjective:     Patient ID: Diane Parks, female   DOB: 06-30-1991, 29 y.o.   MRN: [id number]  HPI Diane Parks is a 29 year old white female, single, G0P0, in to discuss getting on birth control pills,Has irregular periods. She says she had pap and physical last in with [facility].   Review of Systems +irregualr periods, LMP 12/10/19, PMP September 2020 Has never been pregnant, but would like to be sometime   Reviewed past medical,surgical, social and family history. Reviewed medications and allergies.     Objective:   Physical Exam BP 110/68 (BP Location: Left Arm, Patient Position: Sitting, Cuff Size: Large)   Pulse 74   Ht 5\' 1"  (1.549 m)   Wt 208 lb 8 oz (94.6 kg)   LMP 12/10/2019   BMI 39.40 kg/m UPT negative Skin warm and dry. Neck: mid line trachea, normal thyroid, good ROM, no lymphadenopathy noted. Lungs: clear to ausculation bilaterally. Cardiovascular: regular rate and rhythm.Fall risk is low.     Assessment:     1. Pregnancy examination or test, negative result  2. Irregular periods Will rx Taytulla to see of [periods more regular   3. Encounter for initial prescription of contraceptive pills Meds ordered this encounter  Medications  . Norethin Ace-Eth Estrad-FE (TAYTULLA) 1-20 MG-MCG(24) CAPS    Sig: Take 1 tablet by mouth daily.    Dispense:  28 capsule    Refill:  3    Order Specific Question:   Supervising Provider    Answer:   02/07/2020, LUTHER H [[id number]]    4. Encounter for menstrual regulation Will rx Taytulla and follow up in 3 months to see if cycle regular     Plan:     Follow up in 3 months

## 2020-03-15 ENCOUNTER — Ambulatory Visit: Payer: Medicaid Other | Admitting: Adult Health

## 2020-03-16 ENCOUNTER — Encounter: Payer: Self-pay | Admitting: Adult Health

## 2020-03-16 ENCOUNTER — Ambulatory Visit (INDEPENDENT_AMBULATORY_CARE_PROVIDER_SITE_OTHER): Payer: Medicaid Other | Admitting: Adult Health

## 2020-03-16 VITALS — BP 116/80 | HR 72 | Ht 61.0 in | Wt 212.5 lb

## 2020-03-16 DIAGNOSIS — N926 Irregular menstruation, unspecified: Secondary | ICD-10-CM | POA: Diagnosis not present

## 2020-03-16 DIAGNOSIS — Z3202 Encounter for pregnancy test, result negative: Secondary | ICD-10-CM | POA: Diagnosis not present

## 2020-03-16 DIAGNOSIS — Z30013 Encounter for initial prescription of injectable contraceptive: Secondary | ICD-10-CM | POA: Diagnosis not present

## 2020-03-16 LAB — POCT URINE PREGNANCY: Preg Test, Ur: NEGATIVE

## 2020-03-16 MED ORDER — MEDROXYPROGESTERONE ACETATE 150 MG/ML IM SUSP: 150.0000 mg | INTRAMUSCULAR | 2 refills | Status: DC

## 2020-03-16 NOTE — Progress Notes (Signed)
  Subjective:     Patient ID: Diane Parks, female   DOB: December 13, 1990, 29 y.o.   MRN: [id number]  HPI Diane Parks is a 29 year old white female,single,G0P0, in requesting another birth control, does not remember the pill, and periods are irregular.   Review of Systems +irregualr periods, missed several periods now Has been peeing more frequently  Reviewed past medical,surgical, social and family history. Reviewed medications and allergies.     Objective:   Physical Exam BP 116/80 (BP Location: Left Arm, Patient Position: Sitting, Cuff Size: Large)   Pulse 72   Ht 5\' 1"  (1.549 m)   Wt 212 lb 8 oz (96.4 kg)   LMP 12/25/2019 (Approximate)   BMI 40.15 kg/m UPT is negative.Last sex 03/12/20. Skin warm and dry. Lungs: clear to ausculation bilaterally. Cardiovascular: regular rate and rhythm.    Assessment:     1. Pregnancy examination or test, negative result  2. Encounter for initial prescription of injectable contraceptive Discussed options that you don not have to remember, like depo, IUD and nexplanon and ring and she wants to try depo Meds ordered this encounter  Medications  . medroxyPROGESTERone (DEPO-PROVERA) 150 MG/ML injection    Sig: Inject 1 mL (150 mg total) into the muscle every 3 (three) months.    Dispense:  1 mL    Refill:  2    Order Specific Question:   Supervising Provider    Answer:   05/12/20 H [[id number]]   -no sex for 2 weeks, get depo and bring to office for depo in 2 weeks   3. Irregular periods     Plan:     No sex, return in 2 weeks for depo

## 2020-03-28 ENCOUNTER — Ambulatory Visit (INDEPENDENT_AMBULATORY_CARE_PROVIDER_SITE_OTHER): Payer: Medicaid Other | Admitting: *Deleted

## 2020-03-28 ENCOUNTER — Encounter: Payer: Self-pay | Admitting: *Deleted

## 2020-03-28 DIAGNOSIS — Z3042 Encounter for surveillance of injectable contraceptive: Secondary | ICD-10-CM

## 2020-03-28 DIAGNOSIS — Z3202 Encounter for pregnancy test, result negative: Secondary | ICD-10-CM

## 2020-03-28 LAB — POCT URINE PREGNANCY: Preg Test, Ur: NEGATIVE

## 2020-03-28 MED ORDER — MEDROXYPROGESTERONE ACETATE 150 MG/ML IM SUSP
150.0000 mg | Freq: Once | INTRAMUSCULAR | Status: AC
  Administered 2020-03-28: 150 mg via INTRAMUSCULAR

## 2020-03-28 NOTE — Progress Notes (Signed)
   NURSE VISIT- INJECTION  SUBJECTIVE:  Diane Parks is a 29 y.o. G0P0 female here for a Depo Provera for contraception/period management. She is a GYN patient.   OBJECTIVE:  There were no vitals taken for this visit.  Appears well, in no apparent distress  Injection administered in: Right deltoid  Meds ordered this encounter  Medications  . medroxyPROGESTERone (DEPO-PROVERA) injection 150 mg    ASSESSMENT: GYN patient Depo Provera for contraception/period management PLAN: Follow-up: in 11-13 weeks for next Depo   Stoney Bang  03/28/2020 3:56 PM

## 2020-04-09 ENCOUNTER — Emergency Department (HOSPITAL_COMMUNITY): Payer: Self-pay

## 2020-04-09 ENCOUNTER — Emergency Department (HOSPITAL_COMMUNITY)
Admission: EM | Admit: 2020-04-09 | Discharge: 2020-04-09 | Disposition: A | Discharge status: Home / Self Care | Payer: Self-pay | Attending: Emergency Medicine | Admitting: Emergency Medicine

## 2020-04-09 ENCOUNTER — Other Ambulatory Visit: Payer: Self-pay

## 2020-04-09 ENCOUNTER — Encounter (HOSPITAL_COMMUNITY): Payer: Self-pay | Admitting: Emergency Medicine

## 2020-04-09 DIAGNOSIS — G5601 Carpal tunnel syndrome, right upper limb: Secondary | ICD-10-CM | POA: Insufficient documentation

## 2020-04-09 DIAGNOSIS — Z79899 Other long term (current) drug therapy: Secondary | ICD-10-CM | POA: Insufficient documentation

## 2020-04-09 MED ORDER — IBUPROFEN 600 MG PO TABS: 600.0000 mg | ORAL_TABLET | Freq: Four times a day (QID) | ORAL | 0 refills | Status: DC | PRN

## 2020-04-09 NOTE — ED Notes (Signed)
Pt here for eval of wrist pain x 1 month with numbness   She reports she is awakened from sleep for same   No hx of carpal tunnel work up

## 2020-04-09 NOTE — ED Provider Notes (Signed)
[facility] EMERGENCY DEPARTMENT Provider Note   CSN: [id number] Arrival date & time: 04/09/20  1537     History Chief Complaint  Patient presents with  . Numbness    Diane Parks is a 29 y.o. female presenting with an approximate 1 month history of right sided hand and wrist pain and intermittent numbness.  She is right-handed.  She describes waking with numbness that radiates into her thumb index and long fingers almost days which resolve after waking, but goes through a "pins and needles" stage before resolution.  She works in Psych[employer], sport and exercise with repetitive motions.  She also is having some pain while at work.  She has borrowed a friend's wrist splint which she has used for the past week but has had no significant improvement.  She has applied ice which has been helpful, heat tends to make her pain worse.  She has found no other alleviators for her symptoms.  There is no radiation of pain into her forearm and elbow or shoulder.  She denies injury.  HPI     Past Medical History:  Diagnosis Date  . Asthma   . BV (bacterial vaginosis) 06/20/2015  . Chlamydia   . Hidradenitis   . History of chlamydia 07/20/2015  . Missed period 12/06/2015  . Obesity   . PCB (post coital bleeding) 12/06/2015  . UTI (lower urinary tract infection) 06/20/2015  . Vaginal discharge 06/20/2015    Patient Active Problem List   Diagnosis Date Noted  . Pregnancy examination or test, negative result 12/17/2019  . Encounter for initial prescription of contraceptive pills 12/17/2019  . Encounter for menstrual regulation 12/17/2019  . Irregular periods 11/30/2019  . Missed periods 11/30/2019  . Encounter for initial prescription of injectable contraceptive 06/05/2017  . Encounter for gynecological examination with Papanicolaou smear of cervix 06/05/2017  . PCB (post coital bleeding) 12/06/2015  . Missed period 12/06/2015  . History of chlamydia 07/20/2015  . Vaginal discharge 06/20/2015  . BV  (bacterial vaginosis) 06/20/2015  . UTI (lower urinary tract infection) 06/20/2015  . Hidradenitis 03/15/2015  . Obesity 03/07/2014  . Absence of menstruation 02/18/2013    Past Surgical History:  Procedure Laterality Date  . EYE SURGERY    . EYE SURGERY       OB History    Gravida  0   Para      Term      Preterm      AB      Living        SAB      TAB      Ectopic      Multiple      Live Births              Family History  Problem Relation Age of Onset  . Cancer Maternal Grandmother   . Cancer Maternal Grandfather   . Cancer Paternal Grandmother   . Cancer Paternal Grandfather   . Hypertension Mother   . Heart disease Mother     Social History   Tobacco Use  . Smoking status: Never Smoker  . Smokeless tobacco: Never Used  Substance Use Topics  . Alcohol use: No  . Drug use: No    Home Medications Prior to Admission medications   Medication Sig Start Date End Date Taking? Authorizing Provider  ibuprofen (ADVIL) 600 MG tablet Take 1 tablet (600 mg total) by mouth every 6 (six) hours as needed. 04/09/20   Burgess Amor, PA-C  medroxyPROGESTERone (DEPO-PROVERA) 150  MG/ML injection Inject 1 mL (150 mg total) into the muscle every 3 (three) months. 03/16/20   Estill Dooms, NP    Allergies    Patient has no known allergies.  Review of Systems   Review of Systems  Constitutional: Negative for fever.  Musculoskeletal: Positive for arthralgias. Negative for joint swelling and myalgias.  Neurological: Positive for numbness. Negative for weakness.    Physical Exam Updated Vital Signs BP 119/82 (BP Location: Left Arm)   Pulse 78   Temp 98.9 F (37.2 C) (Oral)   Resp 18   Ht 5\' 1"  (1.549 m)   Wt 97.5 kg   LMP 12/30/2019   SpO2 98%   BMI 40.62 kg/m   Physical Exam Constitutional:      Appearance: She is well-developed.  HENT:     Head: Atraumatic.  Cardiovascular:     Comments: Pulses equal bilaterally Musculoskeletal:         General: Tenderness present.     Right wrist: Tenderness present.     Cervical back: Normal range of motion.     Comments: Tender to palpation at right medial wrist.  She has a positive Tinel's sign but negative Phalen's test.  Distal sensation is intact with less than 2-second cap refill in her fingertips.  She also has pain radiating across the palm of her hand with both active and passive extension of her thumb index and long fingers.  There are no palpable cords or knots, no crepitus with range of motion, no erythema or edema.  Skin:    General: Skin is warm and dry.  Neurological:     Mental Status: She is alert.     Sensory: No sensory deficit.     Deep Tendon Reflexes: Reflexes normal.     ED Results / Procedures / Treatments   Labs (all labs ordered are listed, but only abnormal results are displayed) Labs Reviewed - No data to display  EKG None  Radiology DG Hand Complete Right  Result Date: 04/09/2020 CLINICAL DATA:  Right wrist and hand numbness for 1 month EXAM: RIGHT HAND - COMPLETE 3+ VIEW COMPARISON:  None. FINDINGS: Frontal, oblique, and lateral views of the right hand demonstrate no fractures. Alignment is anatomic. Joint spaces are well preserved. Soft tissues are normal. IMPRESSION: 1. Unremarkable right hand. Electronically Signed   By: Randa Ngo M.D.   On: 04/09/2020 18:35    Procedures Procedures (including critical care time)  Medications Ordered in ED Medications - No data to display  ED Course  I have reviewed the triage vital signs and the nursing notes.  Pertinent labs & imaging results that were available during my care of the patient were reviewed by me and considered in my medical decision making (see chart for details).    MDM Rules/Calculators/A&P                      Imaging reviewed and is normal.  I suspect this may be a component of carpal tunnel syndrome.  She was placed in a Velcro wrist splint for resting the wrist joint.  Also  discussed ice, ibuprofen.  Referral to Dr. Aline Brochure for further management if her symptoms persist despite this treatment plan. Final Clinical Impression(s) / ED Diagnoses Final diagnoses:  Carpal tunnel syndrome of right wrist    Rx / DC Orders ED Discharge Orders         Ordered    ibuprofen (ADVIL) 600 MG tablet  Every  6 hours PRN     04/09/20 1907           Victoriano Lain 04/09/20 1916    Bethann Berkshire, MD 04/09/20 [phone]

## 2020-04-09 NOTE — ED Triage Notes (Signed)
Patient c/o numbness to wrist and hand x1 month. Per patient worse at night, wakes her from sleep. Radial pulse strong, capillary refill WNL.

## 2020-04-09 NOTE — Discharge Instructions (Signed)
As discussed, I suspect your symptoms are from carpel tunnel inflammation causing pain and numbness into your fingers. Wear the wrist splint, especially overnight, but also during the day for the next week to help rest your wrist joint.  Apply ice to the wrist, especially if you have increased pain after a work shift.  Take the anti inflammatory medication.  Call Dr Romeo Apple for further management if your symptoms persist.  Your xray is normal (bones and joints are ok).

## 2020-04-13 ENCOUNTER — Ambulatory Visit: Payer: Self-pay | Admitting: Orthopaedic Surgery

## 2020-04-13 ENCOUNTER — Encounter: Payer: Self-pay | Admitting: Orthopaedic Surgery

## 2020-04-13 ENCOUNTER — Other Ambulatory Visit: Payer: Self-pay

## 2020-04-13 VITALS — BP 120/77 | HR 68 | Ht 61.0 in | Wt 209.0 lb

## 2020-04-13 DIAGNOSIS — M79641 Pain in right hand: Secondary | ICD-10-CM

## 2020-04-13 DIAGNOSIS — G5601 Carpal tunnel syndrome, right upper limb: Secondary | ICD-10-CM

## 2020-04-13 NOTE — Progress Notes (Signed)
Subjective:    Patient ID: Diane Parks, female    DOB: 1991/07/22, 29 y.o.   MRN: [id number]  HPI She has been having numbness of the right hand, more at night and has to shake it for it to "wake up".  She works doing repetitive things all shift. She went to the ER 04-09-2020.  I have read the notes. She was given cock-up splint.  She is no better.  She has no trauma. Advil does not help.   Review of Systems  Constitutional: Positive for activity change.  Respiratory: Positive for shortness of breath.   Musculoskeletal: Positive for arthralgias.  All other systems reviewed and are negative.  For Review of Systems, all other systems reviewed and are negative.  The following is a summary of the past history medically, past history surgically, known current medicines, social history and family history.  This information is gathered electronically by the computer from prior information and documentation.  I review this each visit and have found including this information at this point in the chart is beneficial and informative.   Past Medical History:  Diagnosis Date  . Asthma   . BV (bacterial vaginosis) 06/20/2015  . Chlamydia   . Hidradenitis   . History of chlamydia 07/20/2015  . Missed period 12/06/2015  . Obesity   . PCB (post coital bleeding) 12/06/2015  . UTI (lower urinary tract infection) 06/20/2015  . Vaginal discharge 06/20/2015    Past Surgical History:  Procedure Laterality Date  . EYE SURGERY    . EYE SURGERY      Current Outpatient Medications on File Prior to Visit  Medication Sig Dispense Refill  . ibuprofen (ADVIL) 600 MG tablet Take 1 tablet (600 mg total) by mouth every 6 (six) hours as needed. 30 tablet 0  . medroxyPROGESTERone (DEPO-PROVERA) 150 MG/ML injection Inject 1 mL (150 mg total) into the muscle every 3 (three) months. 1 mL 2   No current facility-administered medications on file prior to visit.    Social History   Socioeconomic History  .  Marital status: Single    Spouse name: Not on file  . Number of children: Not on file  . Years of education: Not on file  . Highest education level: Not on file  Occupational History  . Not on file  Tobacco Use  . Smoking status: Never Smoker  . Smokeless tobacco: Never Used  Vaping Use  . Vaping Use: Never used  Substance and Sexual Activity  . Alcohol use: No  . Drug use: No  . Sexual activity: Yes    Birth control/protection: None  Other Topics Concern  . Not on file  Social History Narrative  . Not on file   Social Determinants of Health   Financial Resource Strain:   . Difficulty of Paying Living Expenses:   Food Insecurity:   . Worried About Charity fundraiser in the Last Year:   . Arboriculturist in the Last Year:   Transportation Needs:   . Film/video editor (Medical):   Marland Kitchen Lack of Transportation (Non-Medical):   Physical Activity:   . Days of Exercise per Week:   . Minutes of Exercise per Session:   Stress:   . Feeling of Stress :   Social Connections:   . Frequency of Communication with Friends and Family:   . Frequency of Social Gatherings with Friends and Family:   . Attends Religious Services:   . Active Member of Clubs or  Organizations:   . Attends Banker Meetings:   Marland Kitchen Marital Status:   Intimate Partner Violence:   . Fear of Current or Ex-Partner:   . Emotionally Abused:   Marland Kitchen Physically Abused:   . Sexually Abused:     Family History  Problem Relation Age of Onset  . Cancer Maternal Grandmother   . Cancer Maternal Grandfather   . Cancer Paternal Grandmother   . Cancer Paternal Grandfather   . Hypertension Mother   . Heart disease Mother     BP 120/77   Pulse 68   Ht 5\' 1"  (1.549 m)   Wt 209 lb (94.8 kg)   BMI 39.49 kg/m   Body mass index is 39.49 kg/m.     Objective:   Physical Exam Vitals and nursing note reviewed.  Constitutional:      Appearance: She is well-developed.  HENT:     Head: Normocephalic and  atraumatic.  Eyes:     Conjunctiva/sclera: Conjunctivae normal.     Pupils: Pupils are equal, round, and reactive to light.  Cardiovascular:     Rate and Rhythm: Normal rate and regular rhythm.  Pulmonary:     Effort: Pulmonary effort is normal.  Abdominal:     Palpations: Abdomen is soft.  Musculoskeletal:       Hands:     Cervical back: Normal range of motion and neck supple.  Skin:    General: Skin is warm and dry.  Neurological:     Mental Status: She is alert and oriented to person, place, and time.     Cranial Nerves: No cranial nerve deficit.     Motor: No abnormal muscle tone.     Coordination: Coordination normal.     Deep Tendon Reflexes: Reflexes are normal and symmetric. Reflexes normal.  Psychiatric:        Behavior: Behavior normal.        Thought Content: Thought content normal.        Judgment: Judgment normal.           Assessment & Plan:   Encounter Diagnoses  Name Primary?  . Pain in right hand   . Carpal tunnel syndrome, right upper limb Yes   I will get EMGs.  Continue splint.  Stay out of work.  Return in two weeks.  Call if any problem.  Precautions discussed.   Electronically Signed , MD 6/10/20218:41 AM

## 2020-04-13 NOTE — Patient Instructions (Signed)
Note for out of work until next appointment

## 2020-04-17 ENCOUNTER — Telehealth: Payer: Self-pay | Admitting: Orthopaedic Surgery

## 2020-04-17 NOTE — Telephone Encounter (Signed)
Patient called to relay she has appointment scheduled as per chart note with Dr Alvester Morin for nerve conduction study, on 06/02/20. Asking if she is to keep her scheduled appointment here on 05/02/20. Also may she return to work at least for light duty, before or by this appointment date? In addition, patient is aware to come by to sign release if she elects to receive a copy of her office notes.

## 2020-04-17 NOTE — Telephone Encounter (Addendum)
Spoke with patient again and she told me that she wanted to wait until July so her insurance would be in effect. I told her that we were going to get her scheduled at [facility]. I gave the [facility]G office the necessary information and they are going to speak with her to schedule. I will change the order.

## 2020-04-17 NOTE — Telephone Encounter (Signed)
That is too long to wait. Set her up at another EMG site.  She can work light duty if she desires.  If EMGs take long, then set up at quickest site.

## 2020-04-19 NOTE — Telephone Encounter (Signed)
Patient called back late this afternoon to request a release to work, full duty, no restrictions.  Said she was sent home until she can return to work with this status. States her hand and arm are not bothering her at all, and that she's not having to wear the brace. Patient said she is also going to call and cancel the EMG/nerve conduction study referral appointment. Please advise regarding note.

## 2020-04-19 NOTE — Telephone Encounter (Signed)
Whatever she would like.  Give note to work.

## 2020-04-20 ENCOUNTER — Encounter: Payer: Self-pay | Admitting: Orthopaedic Surgery

## 2020-04-20 NOTE — Telephone Encounter (Signed)
Notified patient; aware to come in to pick up work note.

## 2020-05-02 ENCOUNTER — Ambulatory Visit: Payer: Medicaid Other | Admitting: Orthopaedic Surgery

## 2020-05-20 ENCOUNTER — Emergency Department (HOSPITAL_COMMUNITY)
Admission: EM | Admit: 2020-05-20 | Discharge: 2020-05-20 | Disposition: A | Discharge status: Home / Self Care | Payer: Medicaid Other | Attending: Emergency Medicine | Admitting: Emergency Medicine

## 2020-05-20 ENCOUNTER — Encounter (HOSPITAL_COMMUNITY): Payer: Self-pay

## 2020-05-20 ENCOUNTER — Other Ambulatory Visit: Payer: Self-pay

## 2020-05-20 DIAGNOSIS — R42 Dizziness and giddiness: Secondary | ICD-10-CM | POA: Insufficient documentation

## 2020-05-20 DIAGNOSIS — J45909 Unspecified asthma, uncomplicated: Secondary | ICD-10-CM | POA: Insufficient documentation

## 2020-05-20 DIAGNOSIS — J069 Acute upper respiratory infection, unspecified: Secondary | ICD-10-CM | POA: Insufficient documentation

## 2020-05-20 DIAGNOSIS — R0602 Shortness of breath: Secondary | ICD-10-CM | POA: Insufficient documentation

## 2020-05-20 DIAGNOSIS — Z79818 Long term (current) use of other agents affecting estrogen receptors and estrogen levels: Secondary | ICD-10-CM | POA: Insufficient documentation

## 2020-05-20 DIAGNOSIS — Z20822 Contact with and (suspected) exposure to covid-19: Secondary | ICD-10-CM | POA: Insufficient documentation

## 2020-05-20 DIAGNOSIS — R519 Headache, unspecified: Secondary | ICD-10-CM | POA: Insufficient documentation

## 2020-05-20 LAB — POC SARS CORONAVIRUS 2 AG -  ED: SARS Coronavirus 2 Ag: NEGATIVE

## 2020-05-20 NOTE — Discharge Instructions (Addendum)
Please read the attached instructions about upper respiratory infections.  This is likely a virus, a Covid test has been sent and you will be contacted if your results are positive.  You do not have strep throat, your vital signs are normal, you may take Tylenol or ibuprofen for headache, fevers or aches or pains and over-the-counter Robitussin or dextromethorphan as needed for coughing.  Seek medical exam for severe or worsening symptoms.  If your Covid test is positive you will need to stay out for a total of 10 days from work from the first day of your illness.  You may only return if you are fever free without taking Tylenol or ibuprofen, and if your symptoms are improving

## 2020-05-20 NOTE — ED Provider Notes (Signed)
[facility] EMERGENCY DEPARTMENT Provider Note   CSN: [id number] Arrival date & time: 05/20/20  4098     History Chief Complaint  Patient presents with  . Sore Throat    Diane Parks is a 29 y.o. female.  HPI   This patient is a 29 year old female, history of asthma, presents with several days of sore throat associated with runny nose, cough and a bit of a shortness of breath.  She had had a headache and dizziness yesterday which forced her to go home from work.  She denies any white patches on her throat, sick contacts or recent exotic travel.  She has not really taken anything for her symptoms at all.  She is feeling better today but still having a sore throat.  She decided not to go to work today.  She did have some diarrhea last night but denies nausea or vomiting  Past Medical History:  Diagnosis Date  . Asthma   . BV (bacterial vaginosis) 06/20/2015  . Chlamydia   . Hidradenitis   . History of chlamydia 07/20/2015  . Missed period 12/06/2015  . Obesity   . PCB (post coital bleeding) 12/06/2015  . UTI (lower urinary tract infection) 06/20/2015  . Vaginal discharge 06/20/2015    Patient Active Problem List   Diagnosis Date Noted  . Pregnancy examination or test, negative result 12/17/2019  . Encounter for initial prescription of contraceptive pills 12/17/2019  . Encounter for menstrual regulation 12/17/2019  . Irregular periods 11/30/2019  . Missed periods 11/30/2019  . Encounter for initial prescription of injectable contraceptive 06/05/2017  . Encounter for gynecological examination with Papanicolaou smear of cervix 06/05/2017  . PCB (post coital bleeding) 12/06/2015  . Missed period 12/06/2015  . History of chlamydia 07/20/2015  . Vaginal discharge 06/20/2015  . BV (bacterial vaginosis) 06/20/2015  . UTI (lower urinary tract infection) 06/20/2015  . Hidradenitis 03/15/2015  . Obesity 03/07/2014  . Absence of menstruation 02/18/2013    Past Surgical History:    Procedure Laterality Date  . EYE SURGERY    . EYE SURGERY       OB History    Gravida  0   Para      Term      Preterm      AB      Living        SAB      TAB      Ectopic      Multiple      Live Births              Family History  Problem Relation Age of Onset  . Cancer Maternal Grandmother   . Cancer Maternal Grandfather   . Cancer Paternal Grandmother   . Cancer Paternal Grandfather   . Hypertension Mother   . Heart disease Mother     Social History   Tobacco Use  . Smoking status: Never Smoker  . Smokeless tobacco: Never Used  Vaping Use  . Vaping Use: Never used  Substance Use Topics  . Alcohol use: No  . Drug use: No    Home Medications Prior to Admission medications   Medication Sig Start Date End Date Taking? Authorizing Provider  ibuprofen (ADVIL) 600 MG tablet Take 1 tablet (600 mg total) by mouth every 6 (six) hours as needed. 04/09/20   Burgess Amor, PA-C  medroxyPROGESTERone (DEPO-PROVERA) 150 MG/ML injection Inject 1 mL (150 mg total) into the muscle every 3 (three) months. 03/16/20   Cyril Mourning  A, NP    Allergies    Patient has no known allergies.  Review of Systems   Review of Systems  Constitutional: Negative for fever.  HENT: Positive for congestion, rhinorrhea and sore throat.   Respiratory: Positive for cough and shortness of breath.   Gastrointestinal: Positive for diarrhea.    Physical Exam Updated Vital Signs BP 121/75 (BP Location: Left Arm)   Pulse 76   Temp 98.4 F (36.9 C) (Oral)   Resp 17   Ht 1.549 m (5\' 1" )   Wt 81.6 kg   SpO2 100%   BMI 34.01 kg/m   Physical Exam Vitals and nursing note reviewed.  Constitutional:      Appearance: She is well-developed. She is not diaphoretic.  HENT:     Head: Normocephalic and atraumatic.     Nose: Congestion present. No rhinorrhea.     Mouth/Throat:     Comments: Bilateral tonsils are mildly enlarged, no exudates, normal phonation, mucous membranes are  normal Eyes:     General:        Right eye: No discharge.        Left eye: No discharge.     Conjunctiva/sclera: Conjunctivae normal.  Pulmonary:     Effort: Pulmonary effort is normal. No respiratory distress.  Musculoskeletal:     Cervical back: Normal range of motion.  Lymphadenopathy:     Cervical: No cervical adenopathy.  Skin:    General: Skin is warm and dry.     Findings: No erythema or rash.  Neurological:     Mental Status: She is alert.     Coordination: Coordination normal.     ED Results / Procedures / Treatments   Labs (all labs ordered are listed, but only abnormal results are displayed) Labs Reviewed  POC SARS CORONAVIRUS 2 AG -  ED    EKG None  Radiology No results found.  Procedures Procedures (including critical care time)  Medications Ordered in ED Medications - No data to display  ED Course  I have reviewed the triage vital signs and the nursing notes.  Pertinent labs & imaging results that were available during my care of the patient were reviewed by me and considered in my medical decision making (see chart for details).    MDM Rules/Calculators/A&P                          The patient is well-appearing, vital signs are normal, no fever hypoxia or tachycardia.  Will test for strep and Covid, anticipate discharge with supportive care, she declines any cough medications, recommended quarantine until results are back and that she is feeling better and she expressed understanding.  Final Clinical Impression(s) / ED Diagnoses Final diagnoses:  Upper respiratory tract infection, unspecified type      , MD 05/20/20 0745

## 2020-05-20 NOTE — ED Notes (Signed)
Strep swab obtained.

## 2020-05-20 NOTE — ED Triage Notes (Addendum)
Pt began having a sore throat a few days ago. Today, pt doesn't have a sore throat. Has taken ibuprofen.  Denies any redness or white patches to throat. Has been coughing. Denies any COVID exposure. Hasn't had any COVID vaccines.

## 2020-05-25 ENCOUNTER — Ambulatory Visit: Payer: Medicaid Other | Admitting: Orthopaedic Surgery

## 2020-06-02 ENCOUNTER — Encounter: Payer: Medicaid Other | Admitting: Physical Medicine and Rehabilitation

## 2020-06-13 ENCOUNTER — Ambulatory Visit (INDEPENDENT_AMBULATORY_CARE_PROVIDER_SITE_OTHER): Payer: Medicaid Other | Admitting: *Deleted

## 2020-06-13 DIAGNOSIS — Z3042 Encounter for surveillance of injectable contraceptive: Secondary | ICD-10-CM | POA: Diagnosis not present

## 2020-06-13 MED ORDER — MEDROXYPROGESTERONE ACETATE 150 MG/ML IM SUSP
150.0000 mg | Freq: Once | INTRAMUSCULAR | Status: AC
  Administered 2020-06-13: 150 mg via INTRAMUSCULAR

## 2020-06-13 NOTE — Progress Notes (Signed)
   NURSE VISIT- INJECTION  SUBJECTIVE:  Diane Parks is a 29 y.o. G0P0 female here for a Depo Provera for contraception/period management. She is a GYN patient.   OBJECTIVE:  There were no vitals taken for this visit.  Appears well, in no apparent distress  Injection administered in: Left deltoid  Meds ordered this encounter  Medications  . medroxyPROGESTERone (DEPO-PROVERA) injection 150 mg    ASSESSMENT: GYN patient Depo Provera for contraception/period management PLAN: Follow-up: in 11-13 weeks for next Depo   Jobe Marker  06/13/2020 3:46 PM

## 2020-06-20 ENCOUNTER — Ambulatory Visit: Payer: Medicaid Other

## 2020-07-17 ENCOUNTER — Telehealth: Payer: Self-pay | Admitting: Orthopaedic Surgery

## 2020-07-17 NOTE — Telephone Encounter (Signed)
Patient called.  She now has [facility] and would like to go ahead with nerve testing.  Can you resend order for this?  Thanks

## 2020-07-18 ENCOUNTER — Other Ambulatory Visit: Payer: Self-pay

## 2020-07-18 DIAGNOSIS — G5601 Carpal tunnel syndrome, right upper limb: Secondary | ICD-10-CM

## 2020-07-18 DIAGNOSIS — M79641 Pain in right hand: Secondary | ICD-10-CM

## 2020-07-20 DIAGNOSIS — Z1389 Encounter for screening for other disorder: Secondary | ICD-10-CM | POA: Diagnosis not present

## 2020-07-20 DIAGNOSIS — Z6839 Body mass index (BMI) 39.0-39.9, adult: Secondary | ICD-10-CM | POA: Diagnosis not present

## 2020-07-20 DIAGNOSIS — M40209 Unspecified kyphosis, site unspecified: Secondary | ICD-10-CM | POA: Diagnosis not present

## 2020-07-20 DIAGNOSIS — G5601 Carpal tunnel syndrome, right upper limb: Secondary | ICD-10-CM | POA: Diagnosis not present

## 2020-07-20 DIAGNOSIS — Z0001 Encounter for general adult medical examination with abnormal findings: Secondary | ICD-10-CM | POA: Diagnosis not present

## 2020-07-22 DIAGNOSIS — G5601 Carpal tunnel syndrome, right upper limb: Secondary | ICD-10-CM | POA: Diagnosis not present

## 2020-07-22 DIAGNOSIS — G5600 Carpal tunnel syndrome, unspecified upper limb: Secondary | ICD-10-CM | POA: Diagnosis not present

## 2020-07-22 DIAGNOSIS — M79641 Pain in right hand: Secondary | ICD-10-CM | POA: Diagnosis not present

## 2020-08-21 ENCOUNTER — Encounter: Payer: Medicaid Other | Admitting: Physical Medicine and Rehabilitation

## 2020-08-25 ENCOUNTER — Encounter: Payer: Medicaid Other | Admitting: Physical Medicine and Rehabilitation

## 2020-08-29 ENCOUNTER — Ambulatory Visit: Payer: Medicaid Other | Admitting: Orthopaedic Surgery

## 2020-09-05 ENCOUNTER — Other Ambulatory Visit: Payer: Medicaid Other | Admitting: Adult Health

## 2020-09-05 ENCOUNTER — Ambulatory Visit: Payer: Medicaid Other

## 2020-09-26 ENCOUNTER — Other Ambulatory Visit: Payer: Medicaid Other | Admitting: Adult Health

## 2020-09-27 ENCOUNTER — Other Ambulatory Visit: Payer: Self-pay

## 2020-09-27 ENCOUNTER — Encounter: Payer: Self-pay | Admitting: Advanced Practice Midwife

## 2020-09-27 ENCOUNTER — Ambulatory Visit (INDEPENDENT_AMBULATORY_CARE_PROVIDER_SITE_OTHER): Payer: Medicaid Other | Admitting: Advanced Practice Midwife

## 2020-09-27 ENCOUNTER — Other Ambulatory Visit (HOSPITAL_COMMUNITY)
Admission: RE | Admit: 2020-09-27 | Discharge: 2020-09-27 | Disposition: A | Discharge status: Home / Self Care | Payer: Medicaid Other | Source: Ambulatory Visit | Attending: Adult Health | Admitting: Adult Health

## 2020-09-27 VITALS — BP 130/77 | HR 86 | Ht 61.0 in | Wt 208.5 lb

## 2020-09-27 DIAGNOSIS — Z3009 Encounter for other general counseling and advice on contraception: Secondary | ICD-10-CM | POA: Insufficient documentation

## 2020-09-27 DIAGNOSIS — Z0000 Encounter for general adult medical examination without abnormal findings: Secondary | ICD-10-CM

## 2020-09-27 DIAGNOSIS — Z01419 Encounter for gynecological examination (general) (routine) without abnormal findings: Secondary | ICD-10-CM | POA: Diagnosis not present

## 2020-09-27 NOTE — Patient Instructions (Signed)

## 2020-09-27 NOTE — Progress Notes (Signed)
GYNECOLOGY ANNUAL PREVENTATIVE CARE ENCOUNTER NOTE  Subjective:   Diane Parks is a 29 y.o. G0P0 female here for a routine annual gynecologic exam.  Current complaints: none.   Denies abnormal vaginal bleeding, discharge, pelvic pain, problems with intercourse or other gynecologic concerns.   Patient has been on depo, but she would like to stop this at this time. She is considering a pregnancy.   Was due for depo by 09/12/2020. Has been on Depo May 2021.   Gynecologic History No LMP recorded. Patient has had an injection. Contraception: none Last Pap: 2018. Results were: normal Last mammogram: NA, age. Results were: NA, age   Obstetric History OB History  Gravida Para Term Preterm AB Living  0            SAB TAB Ectopic Multiple Live Births               Past Medical History:  Diagnosis Date  . Asthma   . BV (bacterial vaginosis) 06/20/2015  . Chlamydia   . Hidradenitis   . History of chlamydia 07/20/2015  . Missed period 12/06/2015  . Obesity   . PCB (post coital bleeding) 12/06/2015  . UTI (lower urinary tract infection) 06/20/2015  . Vaginal discharge 06/20/2015    Past Surgical History:  Procedure Laterality Date  . EYE SURGERY    . EYE SURGERY      No current outpatient medications on file prior to visit.   No current facility-administered medications on file prior to visit.    No Known Allergies  Social History   Socioeconomic History  . Marital status: Single    Spouse name: Not on file  . Number of children: Not on file  . Years of education: Not on file  . Highest education level: Not on file  Occupational History  . Not on file  Tobacco Use  . Smoking status: Never Smoker  . Smokeless tobacco: Never Used  Vaping Use  . Vaping Use: Never used  Substance and Sexual Activity  . Alcohol use: No  . Drug use: No  . Sexual activity: Yes    Birth control/protection: None  Other Topics Concern  . Not on file  Social History Narrative  . Not on file    Social Determinants of Health   Financial Resource Strain: Low Risk   . Difficulty of Paying Living Expenses: Not very hard  Food Insecurity: No Food Insecurity  . Worried About Programme researcher, broadcasting/film/video in the Last Year: Never true  . Ran Out of Food in the Last Year: Never true  Transportation Needs: No Transportation Needs  . Lack of Transportation (Medical): No  . Lack of Transportation (Non-Medical): No  Physical Activity: Inactive  . Days of Exercise per Week: 0 days  . Minutes of Exercise per Session: 0 min  Stress: No Stress Concern Present  . Feeling of Stress : Not at all  Social Connections: Socially Isolated  . Frequency of Communication with Friends and Family: Twice a week  . Frequency of Social Gatherings with Friends and Family: Once a week  . Attends Religious Services: Never  . Active Member of Clubs or Organizations: No  . Attends Banker Meetings: Never  . Marital Status: Never married  Intimate Partner Violence: Not At Risk  . Fear of Current or Ex-Partner: No  . Emotionally Abused: No  . Physically Abused: No  . Sexually Abused: No    Family History  Problem Relation Age of Onset  .  Cancer Maternal Grandmother   . Cancer Maternal Grandfather   . Cancer Paternal Grandmother   . Cancer Paternal Grandfather   . Hypertension Mother   . Heart disease Mother     The following portions of the patient's history were reviewed and updated as appropriate: allergies, current medications, past family history, past medical history, past social history, past surgical history and problem list.  Review of Systems Pertinent items noted in HPI and remainder of comprehensive ROS otherwise negative.   Objective:  BP 130/77 (BP Location: Left Arm, Patient Position: Sitting, Cuff Size: Normal)   Pulse 86   Ht 5\' 1"  (1.549 m)   Wt 208 lb 8 oz (94.6 kg)   BMI 39.40 kg/m  CONSTITUTIONAL: Well-developed, well-nourished female in no acute distress.  HENT:   Normocephalic, atraumatic, External right and left ear normal. Oropharynx is clear and moist EYES: Conjunctivae and EOM are normal. Pupils are equal, round, and reactive to light. No scleral icterus.  NECK: Normal range of motion, supple, no masses.  Normal thyroid.  SKIN: Skin is warm and dry. No rash noted. Not diaphoretic. No erythema. No pallor. NEUROLOGIC: Alert and oriented to person, place, and time. Normal reflexes, muscle tone coordination. No cranial nerve deficit noted. PSYCHIATRIC: Normal mood and affect. Normal behavior. Normal judgment and thought content. CARDIOVASCULAR: Normal heart rate noted, regular rhythm RESPIRATORY: Clear to auscultation bilaterally. Effort and breath sounds normal, no problems with respiration noted. BREASTS: Symmetric in size. No masses, skin changes, nipple drainage, or lymphadenopathy. ABDOMEN: Soft, normal bowel sounds, no distention noted.  No tenderness, rebound or guarding.  PELVIC: Normal appearing external genitalia; normal appearing vaginal mucosa and cervix.  No abnormal discharge noted.  Pap smear obtained.  Normal uterine size, no other palpable masses, no uterine or adnexal tenderness. MUSCULOSKELETAL: Normal range of motion. No tenderness.  No cyanosis, clubbing, or edema.  2+ distal pulses.   Assessment and Plan:  1. Routine medical exam - Cytology - PAP( [place])  2. Family planning - Cytology - PAP( [place]) - HIV Antibody (routine testing w rflx) - RPR - Patient wants to stop depo, and does not want to proceed with any birth control at this time. Recommended prenatal vit with folic acid. She is ok with a pregnancy in the future, but not necessarily planning a pregnancy at this time.   Will follow up results of pap smear and manage accordingly. Routine preventative health maintenance measures emphasized. Please refer to After Visit Summary for other counseling recommendations.    DNP, CNM  09/27/20  10:56 AM

## 2020-09-30 DIAGNOSIS — R110 Nausea: Secondary | ICD-10-CM | POA: Diagnosis not present

## 2020-09-30 DIAGNOSIS — R519 Headache, unspecified: Secondary | ICD-10-CM | POA: Diagnosis not present

## 2020-09-30 DIAGNOSIS — G44209 Tension-type headache, unspecified, not intractable: Secondary | ICD-10-CM | POA: Diagnosis not present

## 2020-09-30 DIAGNOSIS — Z20822 Contact with and (suspected) exposure to covid-19: Secondary | ICD-10-CM | POA: Diagnosis not present

## 2020-10-03 LAB — HIV ANTIBODY (ROUTINE TESTING W REFLEX): HIV Screen 4th Generation wRfx: NONREACTIVE

## 2020-10-03 LAB — RPR: RPR Ser Ql: NONREACTIVE

## 2020-10-04 LAB — CYTOLOGY - PAP
Chlamydia: NEGATIVE
Comment: NEGATIVE
Comment: NEGATIVE
Comment: NORMAL
Diagnosis: NEGATIVE
High risk HPV: NEGATIVE
Neisseria Gonorrhea: NEGATIVE

## 2020-11-03 ENCOUNTER — Emergency Department (HOSPITAL_COMMUNITY)
Admission: EM | Admit: 2020-11-03 | Discharge: 2020-11-03 | Disposition: A | Discharge status: Home / Self Care | Payer: Medicaid Other | Attending: Emergency Medicine | Admitting: Emergency Medicine

## 2020-11-03 ENCOUNTER — Encounter (HOSPITAL_COMMUNITY): Payer: Self-pay | Admitting: Emergency Medicine

## 2020-11-03 DIAGNOSIS — Z711 Person with feared health complaint in whom no diagnosis is made: Secondary | ICD-10-CM

## 2020-11-03 DIAGNOSIS — X58XXXA Exposure to other specified factors, initial encounter: Secondary | ICD-10-CM | POA: Insufficient documentation

## 2020-11-03 DIAGNOSIS — J45909 Unspecified asthma, uncomplicated: Secondary | ICD-10-CM | POA: Insufficient documentation

## 2020-11-03 DIAGNOSIS — T192XXA Foreign body in vulva and vagina, initial encounter: Secondary | ICD-10-CM | POA: Insufficient documentation

## 2020-11-03 NOTE — ED Notes (Signed)
No foreign body noted in vagina

## 2020-11-03 NOTE — ED Provider Notes (Signed)
[facility] EMERGENCY DEPARTMENT Provider Note   CSN: [id number] Arrival date & time: 11/03/20  1119     History Chief Complaint  Patient presents with  . Foreign Body in Vagina    Diane Parks is a 29 y.o. female.  The history is provided by the patient and medical records. No language interpreter was used.  Foreign Body in Vagina     29 year old female significant history of STI including chlamydia, BV, presenting with concerns of foreign body in vagina.  Patient reports she had sexual intercourse this morning and then both noticed that the condom was missing.  They suspect it is still retained in her vagina.  She is unable to retrieve it and is here requesting for help.  Otherwise patient denies having any abdominal pain or vaginal bleeding.  She has no other symptoms.  Past Medical History:  Diagnosis Date  . Asthma   . BV (bacterial vaginosis) 06/20/2015  . Chlamydia   . Hidradenitis   . History of chlamydia 07/20/2015  . Missed period 12/06/2015  . Obesity   . PCB (post coital bleeding) 12/06/2015  . UTI (lower urinary tract infection) 06/20/2015  . Vaginal discharge 06/20/2015    Patient Active Problem List   Diagnosis Date Noted  . Pregnancy examination or test, negative result 12/17/2019  . Encounter for initial prescription of contraceptive pills 12/17/2019  . Encounter for menstrual regulation 12/17/2019  . Irregular periods 11/30/2019  . Missed periods 11/30/2019  . Encounter for initial prescription of injectable contraceptive 06/05/2017  . Encounter for gynecological examination with Papanicolaou smear of cervix 06/05/2017  . PCB (post coital bleeding) 12/06/2015  . Missed period 12/06/2015  . History of chlamydia 07/20/2015  . Vaginal discharge 06/20/2015  . BV (bacterial vaginosis) 06/20/2015  . UTI (lower urinary tract infection) 06/20/2015  . Hidradenitis 03/15/2015  . Obesity 03/07/2014  . Absence of menstruation 02/18/2013    Past Surgical  History:  Procedure Laterality Date  . EYE SURGERY    . EYE SURGERY       OB History    Gravida  0   Para      Term      Preterm      AB      Living        SAB      IAB      Ectopic      Multiple      Live Births              Family History  Problem Relation Age of Onset  . Cancer Maternal Grandmother   . Cancer Maternal Grandfather   . Cancer Paternal Grandmother   . Cancer Paternal Grandfather   . Hypertension Mother   . Heart disease Mother     Social History   Tobacco Use  . Smoking status: Never Smoker  . Smokeless tobacco: Never Used  Vaping Use  . Vaping Use: Never used  Substance Use Topics  . Alcohol use: No  . Drug use: No    Home Medications Prior to Admission medications   Not on File    Allergies    Patient has no known allergies.  Review of Systems   Review of Systems  Constitutional: Negative for fever.  Genitourinary: Negative for difficulty urinating, dysuria, flank pain, genital sores, pelvic pain, vaginal bleeding, vaginal discharge and vaginal pain.  Skin: Negative for wound.    Physical Exam Updated Vital Signs BP 107/64 (BP Location: Right Arm)  Pulse 86   Temp 98.3 F (36.8 C) (Oral)   Resp 20   Ht 5\' 1"  (1.549 m)   Wt 94.3 kg   SpO2 99%   BMI 39.30 kg/m   Physical Exam Vitals and nursing note reviewed.  Constitutional:      General: She is not in acute distress.    Appearance: She is well-developed and well-nourished. She is obese.  HENT:     Head: Atraumatic.  Eyes:     Conjunctiva/sclera: Conjunctivae normal.  Abdominal:     Palpations: Abdomen is soft.     Tenderness: There is no abdominal tenderness.  Genitourinary:    Comments: Please refer to pelvic exam note Musculoskeletal:     Cervical back: Neck supple.  Skin:    Findings: No rash.  Neurological:     Mental Status: She is alert.  Psychiatric:        Mood and Affect: Mood and affect and mood normal.     ED Results /  Procedures / Treatments   Labs (all labs ordered are listed, but only abnormal results are displayed) Labs Reviewed - No data to display  EKG None  Radiology No results found.  Procedures Pelvic exam  Date/Time: 11/03/2020 12:06 PM Performed by: 11/05/2020, PA-C Authorized by: Fayrene Helper, PA-C  Consent: Verbal consent obtained. Risks and benefits: risks, benefits and alternatives were discussed Consent given by: patient Patient understanding: patient states understanding of the procedure being performed Patient consent: the patient's understanding of the procedure matches consent given Procedure consent: procedure consent matches procedure scheduled Local anesthesia used: no  Anesthesia: Local anesthesia used: no  Sedation: Patient sedated: no  Patient tolerance: patient tolerated the procedure well with no immediate complications Comments: Chaperone was available for pelvic exam.  Normal external genitalia free of lesion or rash.  No significant discomfort with speculum insertion.  Vaginal vault was fully visualized free of foreign body abnormal lesions, tear, or has any significant vaginal discharge.  Close cervical os.  No tenderness on bimanual exam no foreign body noted.    (including critical care time)  Medications Ordered in ED Medications - No data to display  ED Course  I have reviewed the triage vital signs and the nursing notes.  Pertinent labs & imaging results that were available during my care of the patient were reviewed by me and considered in my medical decision making (see chart for details).    MDM Rules/Calculators/A&P                          BP 107/64 (BP Location: Right Arm)   Pulse 86   Temp 98.3 F (36.8 C) (Oral)   Resp 20   Ht 5\' 1"  (1.549 m)   Wt 94.3 kg   SpO2 99%   BMI 39.30 kg/m   Final Clinical Impression(s) / ED Diagnoses Final diagnoses:  Worried well    Rx / DC Orders ED Discharge Orders    None     11:57  AM Patient here with concerns of a retained condom in her vagina after sexual intercourse this morning.  No other complaint.  Pelvic examination was performed without any evidence of retained foreign body.  No signs of trauma.  I gave patient reassurance however made patient aware of any signs symptoms concerning for retained body such as pelvic pain pelvic discharge fever chills or abnormal foul odor to return.   Fayrene Helper, PA-C 11/03/20 1210  Terrilee Files, MD 11/03/20 [phone]

## 2020-11-03 NOTE — ED Triage Notes (Signed)
Possible condom stuck in vagina that happened today.

## 2020-11-03 NOTE — Discharge Instructions (Signed)
No evidence of foreign body were noted on exam.  It is unlikely that you have a retained condom.  However if you develop vaginal discharge, foul odor, abdominal and or pelvic pain please return.

## 2020-11-20 ENCOUNTER — Emergency Department (HOSPITAL_COMMUNITY)
Admission: EM | Admit: 2020-11-20 | Discharge: 2020-11-20 | Disposition: A | Discharge status: Home / Self Care | Payer: HRSA Program | Attending: Emergency Medicine | Admitting: Emergency Medicine

## 2020-11-20 ENCOUNTER — Other Ambulatory Visit: Payer: Self-pay

## 2020-11-20 ENCOUNTER — Encounter (HOSPITAL_COMMUNITY): Payer: Self-pay

## 2020-11-20 DIAGNOSIS — B349 Viral infection, unspecified: Secondary | ICD-10-CM

## 2020-11-20 DIAGNOSIS — U071 COVID-19: Secondary | ICD-10-CM | POA: Insufficient documentation

## 2020-11-20 DIAGNOSIS — J45909 Unspecified asthma, uncomplicated: Secondary | ICD-10-CM | POA: Diagnosis not present

## 2020-11-20 DIAGNOSIS — R519 Headache, unspecified: Secondary | ICD-10-CM | POA: Diagnosis present

## 2020-11-20 LAB — SARS CORONAVIRUS 2 (TAT 6-24 HRS): SARS Coronavirus 2: POSITIVE — AB

## 2020-11-20 NOTE — Discharge Instructions (Addendum)
Your history and physical exam is suggestive of a viral illness.  You have been tested for COVID-19.    Please maintain isolation precautions.  Check your temperature regularly and take Tylenol as needed for fever control.  Increase your oral hydration and continue to eat regular meals to avoid electrolyte derangement and further fatigue.  I recommend over-the-counter medications as needed for symptom relief.  You may wish to consider obtaining a pulse oximeter over-the-counter at a pharmacy.  If you are below 90% oxygen saturation, you will need to return to the ED.   Follow-up with your primary care provider regarding today's encounter and for ongoing management.  If you do not have one, please get established with one as soon as possible.  If you do not have insurance, please consider the Department of Health.  Return to the ED or seek immediate medical attention should you experience any new or worsening symptoms.    If you have additional questions, contact your local health department or call the epidemiologist on call at [phone] (available 24/7). ? This guidance is subject to change. For the most up-to-date guidance from [facility], please refer to their website: [email or web address]

## 2020-11-20 NOTE — ED Provider Notes (Signed)
[facility] EMERGENCY DEPARTMENT Provider Note   CSN: [id number] Arrival date & time: 11/20/20  5974     History Chief Complaint  Patient presents with  . Headache    Diane Parks is a 30 y.o. female with no relevant past medical history presents to the ED with flu-like symptoms.  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  One of her coworkers recently tested positive for COVID-19.  She became symptomatic of generalized flu-like symptoms including headache, congestion, malaise, diminished appetite, and nonproductive cough on 11/18/2020.  She does not have a thermometer at home, but notes that she has been experiencing subjective fevers and chills.  She states that she has been taking ibuprofen, as needed.  She does not have a primary care provider and states that her health insurance will not start for another 60 days.  She has not been immunized for COVID-19.  She does not take any medications regularly and has no significant comorbid disease aside from her obesity.  Patient denies any smoking history.  Patient denies any numbness or weakness, blurred vision, chest pain, shortness of breath, hemoptysis, abdominal pain, nausea or vomiting, urinary symptoms, or changes in her bowel habits.  HPI     Past Medical History:  Diagnosis Date  . Asthma   . BV (bacterial vaginosis) 06/20/2015  . Chlamydia   . Hidradenitis   . History of chlamydia 07/20/2015  . Missed period 12/06/2015  . Obesity   . PCB (post coital bleeding) 12/06/2015  . UTI (lower urinary tract infection) 06/20/2015  . Vaginal discharge 06/20/2015    Patient Active Problem List   Diagnosis Date Noted  . Pregnancy examination or test, negative result 12/17/2019  . Encounter for initial prescription of contraceptive pills 12/17/2019  . Encounter for menstrual regulation 12/17/2019  . Irregular periods 11/30/2019  . Missed periods 11/30/2019  . Encounter for initial prescription of injectable contraceptive  06/05/2017  . Encounter for gynecological examination with Papanicolaou smear of cervix 06/05/2017  . PCB (post coital bleeding) 12/06/2015  . Missed period 12/06/2015  . History of chlamydia 07/20/2015  . Vaginal discharge 06/20/2015  . BV (bacterial vaginosis) 06/20/2015  . UTI (lower urinary tract infection) 06/20/2015  . Hidradenitis 03/15/2015  . Obesity 03/07/2014  . Absence of menstruation 02/18/2013    Past Surgical History:  Procedure Laterality Date  . EYE SURGERY    . EYE SURGERY       OB History    Gravida  0   Para      Term      Preterm      AB      Living        SAB      IAB      Ectopic      Multiple      Live Births              Family History  Problem Relation Age of Onset  . Cancer Maternal Grandmother   . Cancer Maternal Grandfather   . Cancer Paternal Grandmother   . Cancer Paternal Grandfather   . Hypertension Mother   . Heart disease Mother     Social History   Tobacco Use  . Smoking status: Never Smoker  . Smokeless tobacco: Never Used  Vaping Use  . Vaping Use: Never used  Substance Use Topics  . Alcohol use: No  . Drug use: No    Home Medications Prior to Admission medications   Not on  File    Allergies    Patient has no known allergies.  Review of Systems   Review of Systems  All other systems reviewed and are negative.   Physical Exam Updated Vital Signs BP 115/69 (BP Location: Right Arm)   Pulse (!) 109   Temp 99.3 F (37.4 C) (Oral)   Resp 20   Ht 5\' 1"  (1.549 m)   Wt 94.3 kg   LMP 11/02/2020   SpO2 95%   BMI 39.30 kg/m   Physical Exam Vitals and nursing note reviewed. Exam conducted with a chaperone present.  Constitutional:      General: She is not in acute distress.    Appearance: She is obese. She is not toxic-appearing.  HENT:     Head: Normocephalic and atraumatic.  Eyes:     General: No scleral icterus.    Conjunctiva/sclera: Conjunctivae normal.  Cardiovascular:     Rate  and Rhythm: Normal rate and regular rhythm.     Pulses: Normal pulses.     Heart sounds: Normal heart sounds. No murmur heard.     Comments: Heart rate in the 90s during my examination. Pulmonary:     Effort: Pulmonary effort is normal. No respiratory distress.     Breath sounds: Normal breath sounds. No wheezing or rales.     Comments: No increased work of breathing.  Even, unlabored.  CTA bilaterally. Musculoskeletal:        General: Normal range of motion.     Cervical back: Normal range of motion.  Skin:    General: Skin is dry.  Neurological:     General: No focal deficit present.     Mental Status: She is alert and oriented to person, place, and time.     GCS: GCS eye subscore is 4. GCS verbal subscore is 5. GCS motor subscore is 6.     Cranial Nerves: No cranial nerve deficit.     Sensory: No sensory deficit.     Coordination: Coordination normal.     Gait: Gait normal.  Psychiatric:        Mood and Affect: Mood normal.        Behavior: Behavior normal.        Thought Content: Thought content normal.     ED Results / Procedures / Treatments   Labs (all labs ordered are listed, but only abnormal results are displayed) Labs Reviewed  SARS CORONAVIRUS 2 (TAT 6-24 HRS)    EKG None  Radiology No results found.  Procedures Procedures (including critical care time)  Medications Ordered in ED Medications - No data to display  ED Course  I have reviewed the triage vital signs and the nursing notes.  Pertinent labs & imaging results that were available during my care of the patient were reviewed by me and considered in my medical decision making (see chart for details).    MDM Rules/Calculators/A&P                          Patient with symptoms consistent with viral illness for 2.5 days.  COVID-19 test is obtained and pending.  Discussed influenza testing, but given that it would not change management we have decided not to proceed with testing.   Given brevity  of illness and reassuring physical exam, do not feel as though imaging of chest is warranted. Their symptoms are likely of viral etiology and we discussed that antibiotics are not indicated for viral infections.  Patient will be discharged with symptomatic treatment.  Patient is tolerating food and liquid without difficulty and I do not believe that laboratory work-up would yield any significant findings.  Low suspicion for electrolyte derangement, but emphasized the importance of eating regularly.  I also emphasized the importance of rest, continued oral hydration, and antipyretics as needed for fever control.    Patient is not demonstrating any increased work of breathing or exertional hypoxia.  Given that they are low risk, they are not the ideal candidate for outpatient infusion therapy, particularly given low supplies.   They were provided opportunity to ask any additional questions and have none at this time.  Prior to discharge patient is feeling well, agreeable with plan for discharge home.  They have expressed understanding of verbal discharge instructions as well as return precautions and are agreeable to the plan.   Final Clinical Impression(s) / ED Diagnoses Final diagnoses:  Viral illness    Rx / DC Orders ED Discharge Orders    None       Lorelee New, PA-C 11/20/20 6213    Bethann Berkshire, MD 11/25/20 1351

## 2020-11-20 NOTE — ED Triage Notes (Signed)
Pt presents to ED with complaints of headache, chills, generalized body aches started Saturday night. Pt has had + Covid exposure. Pt has not had covid vaccine.

## 2020-11-21 ENCOUNTER — Other Ambulatory Visit: Payer: Self-pay | Admitting: Family

## 2020-11-21 ENCOUNTER — Telehealth: Payer: Self-pay | Admitting: Family

## 2020-11-21 DIAGNOSIS — U071 COVID-19: Secondary | ICD-10-CM

## 2020-11-21 DIAGNOSIS — Z6839 Body mass index (BMI) 39.0-39.9, adult: Secondary | ICD-10-CM

## 2020-11-21 DIAGNOSIS — Z609 Problem related to social environment, unspecified: Secondary | ICD-10-CM

## 2020-11-21 NOTE — Progress Notes (Signed)
I connected by phone with Diane Parks on 11/21/2020 at 11:56 AM to discuss the potential use of a new treatment for mild to moderate COVID-19 viral infection in non-hospitalized patients.  This patient is a 30 y.o. female that meets the FDA criteria for Emergency Use Authorization of COVID monoclonal antibody sotrovimab.  Has a (+) direct SARS-CoV-2 viral test result  Has mild or moderate COVID-19   Is NOT hospitalized due to COVID-19  Is within 10 days of symptom onset  Has at least one of the high risk factor(s) for progression to severe COVID-19 and/or hospitalization as defined in EUA.  Specific high risk criteria : BMI > 25 and Other high risk medical condition per CDC:  high SVI score   I have spoken and communicated the following to the patient or parent/caregiver regarding COVID monoclonal antibody treatment:  1. FDA has authorized the emergency use for the treatment of mild to moderate COVID-19 in adults and pediatric patients with positive results of direct SARS-CoV-2 viral testing who are 51 years of age and older weighing at least 40 kg, and who are at high risk for progressing to severe COVID-19 and/or hospitalization.  2. The significant known and potential risks and benefits of COVID monoclonal antibody, and the extent to which such potential risks and benefits are unknown.  3. Information on available alternative treatments and the risks and benefits of those alternatives, including clinical trials.  4. Patients treated with COVID monoclonal antibody should continue to self-isolate and use infection control measures (e.g., wear mask, isolate, social distance, avoid sharing personal items, clean and disinfect "high touch" surfaces, and frequent handwashing) according to CDC guidelines.   5. The patient or parent/caregiver has the option to accept or refuse COVID monoclonal antibody treatment.  After reviewing this information with the patient, the patient has agreed to  receive one of the available covid 19 monoclonal antibodies and will be provided an appropriate fact sheet prior to infusion.   [name], FNP 11/21/2020 11:56 AM

## 2020-11-21 NOTE — Telephone Encounter (Signed)
Called to discuss with patient about COVID-19 symptoms and the use of one of the available treatments for those with mild to moderate Covid symptoms and at a high risk of hospitalization.  Pt appears to qualify for outpatient treatment due to co-morbid conditions and/or a member of an at-risk group in accordance with the FDA Emergency Use Authorization.    Symptom onset: 11/18/20 Currently having headache, chills, coughing Vaccinated: No Booster? No Immunocompromised? No Qualifiers: Obesity, high SVI score  I spoke with Diane Parks regarding the risks, benefits, and potential financial costs associated with treatment with Sotrovimab and she wishes to continue with treatment.   Hello Diane Parks,   We contacted you because you were recently diagnosed with COVID-19 and may benefit from a new treatment for mild to moderate disease. This treatment helps reduce the chance of being hospitalized. For some patients with medical conditions that may increase the chances of an infection, the treatment also decreases the risk for serious symptoms related to COVID-19.   The Food and Drug Administration (FDA) approved emergency use of a new drug to treat patients with mild to moderate symptoms who have risk factors that could cause severe symptoms related to COVID-19. This new treatment is a monoclonal antibody. It works by attaching like a magnet to the SARS-CoV2 virus (the virus that causes COVID-19) and stops it from infecting more cells in your body. It does not kill the virus, but it prevents it from spreading throughout your body with the hope that it will decrease your symptoms after it is administered.   This new drug is an intravenous (IV) infusion called Sotrovimab that is given over one 30-minute session in our [facility] outpatient infusion clinic. You will need to stay about 60 minutes after the infusion to ensure you are tolerating it well and to watch for any allergic reaction to the  medication. More information will be given to you at the time of your appointment.   Important information:  . The potential side effects: 2-4% of recipients experience nausea, vomiting, diarrhea, dizziness, headaches, itching, worsening fevers or chills for around 24 hours. . There have been no serious infusion-related reactions. . Of the more than 3,000 patients who received the infusion, only one had an allergic response that ended once the infusion was stopped. This is why we monitor all of our patients closely for 60 minutes after the infusion.  . The COVID-19 vaccine (including boosters) must be delayed at least 90 days after receiving this infusion.  . The medication itself is free, but your insurance will be charged an infusion fee. The amount you may owe later varies from insurance to insurance. If you do not have insurance, we can put you in touch with our billing department. Please contact your insurance agent to discuss prior to your appointment if you would like further details about billing specific to your policy. The CMS code is: M77  . If you have been tested outside of a [facility], you MUST bring a copy of your positive test with you the morning of your appointment. You may take a photo of this and upload to your MyChart portal,  have the testing facility fax the result to [phone] or email a copy to [email or web address].    You have been scheduled to receive the monoclonal antibody therapy at [facility]:  11/22/20 at 11:30 am    The address for the infusion clinic site is:   --The GPS address is [address]  and the parking is located near [employer] building where you will see a "COVID19 Infusion" feather banner marking the entrance to parking. (See photos below.)            --Enter into the 2nd entrance where the "wave, flag banner" is at the road. Turn into this 2nd entrance and immediately turn left or right to park in one of the  marked spaces.   --Please stay in your car and call the desk for assistance inside at [phone]. Let us know which space you are in.    --The average time in the department is roughly 90 minutes to two hours for monoclonal treatment. This includes preparation of the medication, IV start and the required 60-minute monitoring after the infusion.     Should you develop worsening shortness of breath, chest pain or severe breathing problems, please do not wait for this appointment and go to the emergency room for evaluation and treatment instead. You will undergo another oxygen screen before your infusion to ensure this is the best treatment option for you. There is a chance that the best decision may be to send you to the emergency room for evaluation at the time of your appointment.    The day of your visit, you should: Marland Kitchen Get plenty of rest the night before and drink plenty of water. . Eat a light meal/snack before coming and take your medications as prescribed.  . Wear warm, comfortable clothes with a shirt that can roll-up over the elbow (will need IV start).  . Wear a mask.  . Consider bringing an activity to help pass the time.     Regards,  Diane Parks

## 2020-11-22 ENCOUNTER — Ambulatory Visit (HOSPITAL_COMMUNITY)
Admission: RE | Admit: 2020-11-22 | Discharge: 2020-11-22 | Disposition: A | Discharge status: Home / Self Care | Payer: Medicaid Other | Source: Ambulatory Visit | Attending: Pulmonary Disease | Admitting: Pulmonary Disease

## 2020-11-22 DIAGNOSIS — Z6839 Body mass index (BMI) 39.0-39.9, adult: Secondary | ICD-10-CM

## 2020-11-22 DIAGNOSIS — Z609 Problem related to social environment, unspecified: Secondary | ICD-10-CM | POA: Insufficient documentation

## 2020-11-22 DIAGNOSIS — U071 COVID-19: Secondary | ICD-10-CM | POA: Diagnosis present

## 2020-11-22 MED ORDER — SODIUM CHLORIDE 0.9 % IV SOLN: INTRAVENOUS | Status: DC | PRN

## 2020-11-22 MED ORDER — DIPHENHYDRAMINE HCL 50 MG/ML IJ SOLN: 50.0000 mg | Freq: Once | INTRAMUSCULAR | Status: DC | PRN

## 2020-11-22 MED ORDER — SOTROVIMAB 500 MG/8ML IV SOLN
500.0000 mg | Freq: Once | INTRAVENOUS | Status: AC
  Administered 2020-11-22: 500 mg via INTRAVENOUS

## 2020-11-22 MED ORDER — METHYLPREDNISOLONE SODIUM SUCC 125 MG IJ SOLR: 125.0000 mg | Freq: Once | INTRAMUSCULAR | Status: DC | PRN

## 2020-11-22 MED ORDER — EPINEPHRINE 0.3 MG/0.3ML IJ SOAJ: 0.3000 mg | Freq: Once | INTRAMUSCULAR | Status: DC | PRN

## 2020-11-22 MED ORDER — FAMOTIDINE IN NACL 20-0.9 MG/50ML-% IV SOLN: 20.0000 mg | Freq: Once | INTRAVENOUS | Status: DC | PRN

## 2020-11-22 MED ORDER — ALBUTEROL SULFATE HFA 108 (90 BASE) MCG/ACT IN AERS: 2.0000 | INHALATION_SPRAY | Freq: Once | RESPIRATORY_TRACT | Status: DC | PRN

## 2020-11-22 NOTE — Progress Notes (Signed)
Diagnosis: COVID-19  Physician: Dr. Patrick Wright  Procedure: Covid Infusion Clinic Med: Sotrovimab infusion - Provided patient with sotrovimab fact sheet for patients, parents, and caregivers prior to infusion.   Complications: No immediate complications noted  Discharge: Discharged home    

## 2020-11-22 NOTE — Progress Notes (Signed)
Patient reviewed Fact Sheet for Patients, Parents, and Caregivers for Emergency Use Authorization (EUA) of sotrovimab for the Treatment of Coronavirus. Patient also reviewed and is agreeable to the estimated cost of treatment. Patient is agreeable to proceed.   

## 2020-11-22 NOTE — Discharge Instructions (Signed)

## 2021-04-17 ENCOUNTER — Other Ambulatory Visit: Payer: Self-pay

## 2021-04-17 ENCOUNTER — Encounter (HOSPITAL_COMMUNITY): Payer: Self-pay | Admitting: Emergency Medicine

## 2021-04-17 ENCOUNTER — Emergency Department (HOSPITAL_COMMUNITY)
Admission: EM | Admit: 2021-04-17 | Discharge: 2021-04-17 | Disposition: A | Discharge status: Home / Self Care | Payer: Medicaid Other | Attending: Emergency Medicine | Admitting: Emergency Medicine

## 2021-04-17 DIAGNOSIS — Z20822 Contact with and (suspected) exposure to covid-19: Secondary | ICD-10-CM | POA: Insufficient documentation

## 2021-04-17 DIAGNOSIS — R519 Headache, unspecified: Secondary | ICD-10-CM

## 2021-04-17 DIAGNOSIS — R531 Weakness: Secondary | ICD-10-CM | POA: Insufficient documentation

## 2021-04-17 DIAGNOSIS — Z28310 Unvaccinated for covid-19: Secondary | ICD-10-CM | POA: Insufficient documentation

## 2021-04-17 DIAGNOSIS — J029 Acute pharyngitis, unspecified: Secondary | ICD-10-CM | POA: Insufficient documentation

## 2021-04-17 DIAGNOSIS — J45909 Unspecified asthma, uncomplicated: Secondary | ICD-10-CM | POA: Insufficient documentation

## 2021-04-17 LAB — RESP PANEL BY RT-PCR (FLU A&B, COVID) ARPGX2
Influenza A by PCR: NEGATIVE
Influenza B by PCR: NEGATIVE
SARS Coronavirus 2 by RT PCR: NEGATIVE

## 2021-04-17 LAB — GROUP A STREP BY PCR: Group A Strep by PCR: NOT DETECTED

## 2021-04-17 MED ORDER — DEXAMETHASONE SODIUM PHOSPHATE 10 MG/ML IJ SOLN
10.0000 mg | Freq: Once | INTRAMUSCULAR | Status: AC
  Administered 2021-04-17: 13:00:00 10 mg via INTRAMUSCULAR
  Filled 2021-04-17: qty 1

## 2021-04-17 MED ORDER — AMOXICILLIN 500 MG PO CAPS: 500.0000 mg | ORAL_CAPSULE | Freq: Two times a day (BID) | ORAL | 0 refills | Status: AC

## 2021-04-17 MED ORDER — ACETAMINOPHEN 325 MG PO TABS
650.0000 mg | ORAL_TABLET | Freq: Once | ORAL | Status: AC
  Administered 2021-04-17: 13:00:00 650 mg via ORAL
  Filled 2021-04-17: qty 2

## 2021-04-17 NOTE — Discharge Instructions (Addendum)
It was a pleasure taking care of you today. As discussed, your strep, COVID, and influenza test were negative. Given the appearance of your throat, I am still going to treat you for strep throat. Take antibiotics as prescribed and finish all antibiotics. You may take over the counter ibuprofen or tylenol as needed for pain. Follow-up with PCP if symptoms do not improve within the next week. Return to the ER for new or worsening symptoms.

## 2021-04-17 NOTE — ED Provider Notes (Signed)
[facility] EMERGENCY DEPARTMENT Provider Note   CSN: [id number] Arrival date & time: 04/17/21  3762     History Chief Complaint  Patient presents with   Sore Throat    Diane Parks is a 30 y.o. female with a past medical history significant for asthma who presents to the ED due to worsening sore throat for the past few days.  Patient states pain is worse on left and worse when swallowing and talking.  Denies difficulties swallowing or difficulties breathing.  She also admits to intermittent frontal headaches associated with generalized weakness. She has tried ibuprofen with no relief. Denies changes to vision, changes to speech, unilateral weakness.  Has a history of headaches and notes this feels similar.  No recent head injury.  Denies nausea or vomiting.  Denies cough.  No chest pain, fever, chills, abdominal pain, dizziness. Denies sick contacts and known COVID exposures.  She is unvaccinated against COVID-19.  No treatment prior to arrival.  History obtained from patient and past medical records. No interpreter used during encounter.       Past Medical History:  Diagnosis Date   Asthma    BV (bacterial vaginosis) 06/20/2015   Chlamydia    Hidradenitis    History of chlamydia 07/20/2015   Missed period 12/06/2015   Obesity    PCB (post coital bleeding) 12/06/2015   UTI (lower urinary tract infection) 06/20/2015   Vaginal discharge 06/20/2015    Patient Active Problem List   Diagnosis Date Noted   Pregnancy examination or test, negative result 12/17/2019   Encounter for initial prescription of contraceptive pills 12/17/2019   Encounter for menstrual regulation 12/17/2019   Irregular periods 11/30/2019   Missed periods 11/30/2019   Encounter for initial prescription of injectable contraceptive 06/05/2017   Encounter for gynecological examination with Papanicolaou smear of cervix 06/05/2017   PCB (post coital bleeding) 12/06/2015   Missed period 12/06/2015   History of  chlamydia 07/20/2015   Vaginal discharge 06/20/2015   BV (bacterial vaginosis) 06/20/2015   UTI (lower urinary tract infection) 06/20/2015   Hidradenitis 03/15/2015   Obesity 03/07/2014   Absence of menstruation 02/18/2013    Past Surgical History:  Procedure Laterality Date   EYE SURGERY     EYE SURGERY       OB History     Gravida  0   Para      Term      Preterm      AB      Living         SAB      IAB      Ectopic      Multiple      Live Births              Family History  Problem Relation Age of Onset   Cancer Maternal Grandmother    Cancer Maternal Grandfather    Cancer Paternal Grandmother    Cancer Paternal Grandfather    Hypertension Mother    Heart disease Mother     Social History   Tobacco Use   Smoking status: Never   Smokeless tobacco: Never  Vaping Use   Vaping Use: Never used  Substance Use Topics   Alcohol use: No   Drug use: No    Home Medications Prior to Admission medications   Medication Sig Start Date End Date Taking? Authorizing Provider  amoxicillin (AMOXIL) 500 MG capsule Take 1 capsule (500 mg total) by mouth 2 (two) times daily for  10 days. 04/17/21 04/27/21 Yes Mannie Stabile, PA-C    Allergies    Patient has no known allergies.  Review of Systems   Review of Systems  Constitutional:  Negative for chills and fever.  HENT:  Positive for sore throat. Negative for congestion, ear pain, rhinorrhea, trouble swallowing and voice change.   Respiratory:  Negative for cough and shortness of breath.   Cardiovascular:  Negative for chest pain.  Neurological:  Positive for weakness (generalized) and headaches.  All other systems reviewed and are negative.  Physical Exam Updated Vital Signs BP 135/74 (BP Location: Left Arm)   Pulse 90   Temp 98.3 F (36.8 C) (Oral)   Resp 18   Ht 5\' 1"  (1.549 m)   Wt 91.2 kg   LMP 04/04/2021 (Exact Date)   SpO2 97%   BMI 37.98 kg/m   Physical Exam Vitals and nursing  note reviewed.  Constitutional:      General: She is not in acute distress.    Appearance: She is not ill-appearing.  HENT:     Head: Normocephalic.     Mouth/Throat:     Comments: Posterior oropharynx clear and mucous membranes moist, there erythema with 2+ tonsillar hypertrophy and edema to uvula. No exudates. uvula midline, normal phonation, no trismus, tolerating secretions without difficulty. Eyes:     Pupils: Pupils are equal, round, and reactive to light.  Cardiovascular:     Rate and Rhythm: Normal rate and regular rhythm.     Pulses: Normal pulses.     Heart sounds: Normal heart sounds. No murmur heard.   No friction rub. No gallop.  Pulmonary:     Effort: Pulmonary effort is normal.     Breath sounds: Normal breath sounds.  Abdominal:     General: Abdomen is flat. There is no distension.     Palpations: Abdomen is soft.     Tenderness: There is no abdominal tenderness. There is no guarding or rebound.  Musculoskeletal:        General: Normal range of motion.     Cervical back: Neck supple.  Skin:    General: Skin is warm and dry.  Neurological:     General: No focal deficit present.     Mental Status: She is alert.     Comments: Cranial nerves grossly intact.  No facial droop.  Normal speech.  Psychiatric:        Mood and Affect: Mood normal.        Behavior: Behavior normal.    ED Results / Procedures / Treatments   Labs (all labs ordered are listed, but only abnormal results are displayed) Labs Reviewed  GROUP A STREP BY PCR  RESP PANEL BY RT-PCR (FLU A&B, COVID) [id number]  POC URINE PREG, ED    EKG None  Radiology No results found.  Procedures Procedures   Medications Ordered in ED Medications  dexamethasone (DECADRON) injection 10 mg (has no administration in time range)  acetaminophen (TYLENOL) tablet 650 mg (has no administration in time range)    ED Course  I have reviewed the triage vital signs and the nursing notes.  Pertinent labs &  imaging results that were available during my care of the patient were reviewed by me and considered in my medical decision making (see chart for details).  Clinical Course as of 04/17/21 1258  Tue Apr 17, 2021  1231 Group A Strep by PCR: NOT DETECTED [CA]  1231 SARS Coronavirus 2 by RT PCR: NEGATIVE [CA]  Clinical Course User Index [CA] Jesusita Oka   MDM Rules/Calculators/A&P                         30 year old female presents to the ED due to sore throat associated with intermittent headache and generalized weakness for the past few days.  No sick contacts or known COVID exposures.  Patient denies changes to phonation, difficulty swallowing, difficulties breathing.  No fever or chills.  Vitals all within normal limits.  Patient is afebrile, not tachycardic or hypoxic.  Patient nontoxic-appearing.  Physical exam significant for erythematous throat with 2+ bilateral tonsillar hypertrophy.  No exudates.  Uvula midline.  No abscess appreciated on exam.  Normal phonation.  Patient tolerating oral secretions without difficulty.  Normal neurological exam.  Strep, COVID, influenza test ordered. Steroids and Tylenol.   COVID, Influenza, and strep negative. Given appearance of throat, will treat for strep with amoxicillin. Decadron given. Patient able to tolerate po at bedside without difficulty.  Encouraged patient to take over-the-counter ibuprofen or Tylenol as needed for pain.  Advised her to follow-up with PCP if symptoms do not improve within the next week. Strict ED precautions discussed with patient. Patient states understanding and agrees to plan. Patient discharged home in no acute distress and stable vitals.  Final Clinical Impression(s) / ED Diagnoses Final diagnoses:  Pharyngitis, unspecified etiology  Acute nonintractable headache, unspecified headache type    Rx / DC Orders ED Discharge Orders          Ordered    amoxicillin (AMOXIL) 500 MG capsule  2 times daily         04/17/21 1235             Jesusita Oka 04/17/21 1259    Blane Ohara, MD 04/23/21 1553

## 2021-04-17 NOTE — ED Triage Notes (Signed)
Pt to the ED with a sore throat that began last week.  Pt also c/o a headache and weakness.  Pt has not been covid tested.

## 2021-07-11 ENCOUNTER — Other Ambulatory Visit: Payer: Self-pay

## 2021-07-11 ENCOUNTER — Emergency Department (HOSPITAL_COMMUNITY)
Admission: EM | Admit: 2021-07-11 | Discharge: 2021-07-11 | Disposition: A | Discharge status: Home / Self Care | Payer: Medicaid Other | Attending: Emergency Medicine | Admitting: Emergency Medicine

## 2021-07-11 DIAGNOSIS — Z20822 Contact with and (suspected) exposure to covid-19: Secondary | ICD-10-CM

## 2021-07-11 DIAGNOSIS — Z28310 Unvaccinated for covid-19: Secondary | ICD-10-CM | POA: Insufficient documentation

## 2021-07-11 DIAGNOSIS — U071 COVID-19: Secondary | ICD-10-CM | POA: Insufficient documentation

## 2021-07-11 NOTE — ED Notes (Signed)
Pt rec'd work note and d/c papers.

## 2021-07-11 NOTE — ED Provider Notes (Signed)
[facility] EMERGENCY DEPARTMENT Provider Note   CSN: [id number] Arrival date & time: 07/11/21  1907     History Chief Complaint  Patient presents with   Covid Test   Work Note    Clotiel Diane Parks is a 30 y.o. female.  She is complaining of 1 day of fevers chills headache body aches.  Roommate tested positive for COVID.  She is asking for a COVID test.  She is not COVID vaccinated  The history is provided by the patient.  Influenza Presenting symptoms: fever, headache, myalgias and rhinorrhea   Presenting symptoms: no shortness of breath and no sore throat   Severity:  Moderate Onset quality:  Gradual Duration:  1 day Progression:  Unchanged Chronicity:  New Relieved by:  None tried Worsened by:  Nothing Ineffective treatments:  None tried Associated symptoms: no neck stiffness       Past Medical History:  Diagnosis Date   Asthma    BV (bacterial vaginosis) 06/20/2015   Chlamydia    Hidradenitis    History of chlamydia 07/20/2015   Missed period 12/06/2015   Obesity    PCB (post coital bleeding) 12/06/2015   UTI (lower urinary tract infection) 06/20/2015   Vaginal discharge 06/20/2015    Patient Active Problem List   Diagnosis Date Noted   Pregnancy examination or test, negative result 12/17/2019   Encounter for initial prescription of contraceptive pills 12/17/2019   Encounter for menstrual regulation 12/17/2019   Irregular periods 11/30/2019   Missed periods 11/30/2019   Encounter for initial prescription of injectable contraceptive 06/05/2017   Encounter for gynecological examination with Papanicolaou smear of cervix 06/05/2017   PCB (post coital bleeding) 12/06/2015   Missed period 12/06/2015   History of chlamydia 07/20/2015   Vaginal discharge 06/20/2015   BV (bacterial vaginosis) 06/20/2015   UTI (lower urinary tract infection) 06/20/2015   Hidradenitis 03/15/2015   Obesity 03/07/2014   Absence of menstruation 02/18/2013    Past Surgical History:   Procedure Laterality Date   EYE SURGERY     EYE SURGERY       OB History     Gravida  0   Para      Term      Preterm      AB      Living         SAB      IAB      Ectopic      Multiple      Live Births              Family History  Problem Relation Age of Onset   Cancer Maternal Grandmother    Cancer Maternal Grandfather    Cancer Paternal Grandmother    Cancer Paternal Grandfather    Hypertension Mother    Heart disease Mother     Social History   Tobacco Use   Smoking status: Never   Smokeless tobacco: Never  Vaping Use   Vaping Use: Never used  Substance Use Topics   Alcohol use: No   Drug use: No    Home Medications Prior to Admission medications   Not on File    Allergies    Patient has no known allergies.  Review of Systems   Review of Systems  Constitutional:  Positive for fever.  HENT:  Positive for rhinorrhea. Negative for sore throat.   Eyes:  Negative for visual disturbance.  Respiratory:  Negative for shortness of breath.   Cardiovascular:  Negative for  chest pain.  Gastrointestinal:  Negative for abdominal pain.  Genitourinary:  Negative for dysuria.  Musculoskeletal:  Positive for myalgias. Negative for neck stiffness.  Skin:  Negative for rash.  Neurological:  Positive for headaches.   Physical Exam Updated Vital Signs BP 107/72 (BP Location: Right Arm)   Pulse (!) 101   Temp 98.9 F (37.2 C)   Resp 17   Ht 5\' 1"  (1.549 m)   Wt 95.3 kg   LMP 05/20/2021 (Approximate)   SpO2 93%   BMI 39.68 kg/m   Physical Exam Vitals and nursing note reviewed.  Constitutional:      General: She is not in acute distress.    Appearance: Normal appearance. She is well-developed.  HENT:     Head: Normocephalic and atraumatic.  Eyes:     Conjunctiva/sclera: Conjunctivae normal.  Cardiovascular:     Rate and Rhythm: Normal rate and regular rhythm.     Heart sounds: No murmur heard. Pulmonary:     Effort: Pulmonary  effort is normal. No respiratory distress.     Breath sounds: Normal breath sounds.  Abdominal:     Palpations: Abdomen is soft.     Tenderness: There is no abdominal tenderness.  Musculoskeletal:     Cervical back: Neck supple.  Skin:    General: Skin is warm and dry.  Neurological:     General: No focal deficit present.     Mental Status: She is alert.    ED Results / Procedures / Treatments   Labs (all labs ordered are listed, but only abnormal results are displayed) Labs Reviewed  SARS CORONAVIRUS 2 (TAT 6-24 HRS)    EKG None  Radiology No results found.  Procedures Procedures   Medications Ordered in ED Medications - No data to display  ED Course  I have reviewed the triage vital signs and the nursing notes.  Pertinent labs & imaging results that were available during my care of the patient were reviewed by me and considered in my medical decision making (see chart for details).    MDM Rules/Calculators/A&P                          Merrilee Ancona was evaluated in Emergency Department on 07/11/2021 for the symptoms described in the history of present illness. She was evaluated in the context of the global COVID-19 pandemic, which necessitated consideration that the patient might be at risk for infection with the SARS-CoV-2 virus that causes COVID-19. Institutional protocols and algorithms that pertain to the evaluation of patients at risk for COVID-19 are in a state of rapid change based on information released by regulatory bodies including the CDC and federal and state organizations. These policies and algorithms were followed during the patient's care in the ED.   Final Clinical Impression(s) / ED Diagnoses Final diagnoses:  Person under investigation for COVID-19    Rx / DC Orders ED Discharge Orders     None        09/10/2021, MD 07/12/21 1014

## 2021-07-11 NOTE — Discharge Instructions (Signed)
You were seen in the emergency department after a COVID exposure and possible COVID-like symptoms.  Your COVID test is pending at time of discharge.  You can follow this up in MyChart.  You should isolate until your tests are back.  If you test positive you will need to isolate for at least 5 days from the beginning of your symptoms.

## 2021-07-11 NOTE — ED Triage Notes (Signed)
Pt here from home with cc of wanting covid test- her roommate just tested positive. Has fever, chills, headache at home. Also wants work note.

## 2021-07-12 LAB — SARS CORONAVIRUS 2 (TAT 6-24 HRS): SARS Coronavirus 2: POSITIVE — AB

## 2021-10-02 ENCOUNTER — Other Ambulatory Visit: Payer: Medicaid Other | Admitting: Adult Health

## 2022-01-23 ENCOUNTER — Other Ambulatory Visit: Payer: Medicaid Other | Admitting: Adult Health

## 2022-03-05 ENCOUNTER — Other Ambulatory Visit (HOSPITAL_COMMUNITY)
Admission: RE | Admit: 2022-03-05 | Discharge: 2022-03-05 | Disposition: A | Discharge status: Home / Self Care | Payer: Medicaid Other | Source: Ambulatory Visit | Attending: Adult Health | Admitting: Adult Health

## 2022-03-05 ENCOUNTER — Encounter: Payer: Self-pay | Admitting: Adult Health

## 2022-03-05 ENCOUNTER — Ambulatory Visit (INDEPENDENT_AMBULATORY_CARE_PROVIDER_SITE_OTHER): Payer: Medicaid Other | Admitting: Adult Health

## 2022-03-05 VITALS — BP 106/70 | HR 74 | Ht 60.25 in | Wt 209.0 lb

## 2022-03-05 DIAGNOSIS — Z3189 Encounter for other procreative management: Secondary | ICD-10-CM

## 2022-03-05 DIAGNOSIS — Z3009 Encounter for other general counseling and advice on contraception: Secondary | ICD-10-CM

## 2022-03-05 DIAGNOSIS — Z113 Encounter for screening for infections with a predominantly sexual mode of transmission: Secondary | ICD-10-CM | POA: Insufficient documentation

## 2022-03-05 DIAGNOSIS — Z01419 Encounter for gynecological examination (general) (routine) without abnormal findings: Secondary | ICD-10-CM

## 2022-03-05 DIAGNOSIS — Z0000 Encounter for general adult medical examination without abnormal findings: Secondary | ICD-10-CM

## 2022-03-05 DIAGNOSIS — Z319 Encounter for procreative management, unspecified: Secondary | ICD-10-CM | POA: Diagnosis not present

## 2022-03-05 MED ORDER — PRENATAL PLUS 27-1 MG PO TABS: 1.0000 | ORAL_TABLET | Freq: Every day | ORAL | 12 refills | Status: DC

## 2022-03-05 NOTE — Progress Notes (Signed)
Patient ID: Diane Parks, female   DOB: 09/07/1991, 31 y.o.   MRN: [id number] ?History of Present Illness: ?Diane Parks is a 31 year old white female,single, G0P0, in for a well woman gyn exam and wants to get pregnant.  ? ?Lab Results  ?Component Value Date  ? DIAGPAP  09/27/2020  ?  - Negative for intraepithelial lesion or malignancy (NILM)  ? HPVHIGH Negative 09/27/2020  ?  ?Current Medications, Allergies, Past Medical History, Past Surgical History, Family History and Social History were reviewed in [employer] record.   ? ? ?Review of Systems: ? ?Patient denies any headaches, hearing loss, fatigue, blurred vision, shortness of breath, chest pain, abdominal pain, problems with bowel movements, urination, or intercourse. No joint pain or mood swings.  ?Periods are regular. ? ?Physical Exam:BP 106/70 (BP Location: Left Arm, Patient Position: Sitting, Cuff Size: Large)   Pulse 74   Ht 5' 0.25" (1.53 m)   Wt 209 lb (94.8 kg)   LMP 02/19/2022   BMI 40.48 kg/m?   ?General:  Well developed, well nourished, no acute distress ?Skin:  Warm and dry ?Neck:  Midline trachea, normal thyroid, good ROM, no lymphadenopathy ?Lungs; Clear to auscultation bilaterally ?Breast:  No dominant palpable mass, retraction, or nipple discharge ?Cardiovascular: Regular rate and rhythm ?Abdomen:  Soft, non tender, no hepatosplenomegaly ?Pelvic:  External genitalia is normal in appearance, no lesions.  The vagina is normal in appearance. Urethra has no lesions or masses. The cervix is smooth, CV swab obtained.  Uterus is felt to be normal size, shape, and contour.  No adnexal masses or tenderness noted.Bladder is non tender, no masses felt. ?Rectal: Deferred  ?Extremities/musculoskeletal:  No swelling or varicosities noted, no clubbing or cyanosis ?Psych:  No mood changes, alert and cooperative,seems happy ?AA is 0 ?Fall risk is low ? ?  03/05/2022  ?  3:32 PM 09/27/2020  ? 10:46 AM 06/05/2017  ?  2:06 PM  ?Depression  screen PHQ 2/9  ?Decreased Interest 0 0 0  ?Down, Depressed, Hopeless 0 0 0  ?PHQ - 2 Score 0 0 0  ?Altered sleeping 0 0   ?Tired, decreased energy 0 0   ?Change in appetite 0 0   ?Feeling bad or failure about yourself  0 0   ?Trouble concentrating 0 0   ?Moving slowly or fidgety/restless 0 0   ?Suicidal thoughts 0 0   ?PHQ-9 Score 0 0   ?  ? ?  03/05/2022  ?  3:32 PM 09/27/2020  ? 10:46 AM  ?GAD 7 : Generalized Anxiety Score  ?Nervous, Anxious, on Edge 0 0  ?Control/stop worrying 0 0  ?Worry too much - different things 0 0  ?Trouble relaxing 0 0  ?Restless 0 0  ?Easily annoyed or irritable 0 0  ?Afraid - awful might happen 0 0  ?Total GAD 7 Score 0 0  ? ? Upstream - 03/05/22 1535   ? ?  ? Pregnancy Intention Screening  ? Does the patient want to become pregnant in the next year? Yes   ? Does the patient's partner want to become pregnant in the next year? Yes   ? Would the patient like to discuss contraceptive options today? No   ?  ? Contraception Wrap Up  ? Current Method Female Condom   ? End Method Pregnant/Seeking Pregnancy   ? ?  ?  ? ?  ?  ? Examination chaperoned by Malachy Mood LPN ? ? ?Impression and Plan: ?1. Encounter for  well woman exam with routine gynecological exam ?Pap and physical in 1 year ? ?2. Patient desires pregnancy ?Take PNV ?Meds ordered this encounter  ?Medications  ? prenatal vitamin w/FE, FA (PRENATAL 1 + 1) 27-1 MG TABS tablet  ?  Sig: Take 1 tablet by mouth daily at 12 noon.  ?  Dispense:  30 tablet  ?  Refill:  12  ?  Order Specific Question:   Supervising Provider  ?  Answer:   Duane Lope H [[id number]]  ?  ?Discussed timing of sex, ? ?3. Family planning ? ?4. Screening examination for STD (sexually transmitted disease) ?CV swab sent for GC/CHL and trich ?Check labs ?- Hepatitis C antibody ?- Hepatitis B surface antigen ?- HIV Antibody (routine testing w rflx) ?- RPR ?- Cervicovaginal ancillary only( [place])  ? ? ? ?  ?  ?

## 2022-03-06 LAB — HIV ANTIBODY (ROUTINE TESTING W REFLEX): HIV Screen 4th Generation wRfx: NONREACTIVE

## 2022-03-06 LAB — HEPATITIS B SURFACE ANTIGEN: Hepatitis B Surface Ag: NEGATIVE

## 2022-03-06 LAB — HEPATITIS C ANTIBODY: Hep C Virus Ab: NONREACTIVE

## 2022-03-06 LAB — RPR: RPR Ser Ql: NONREACTIVE

## 2022-03-07 LAB — CERVICOVAGINAL ANCILLARY ONLY
Chlamydia: NEGATIVE
Comment: NEGATIVE
Comment: NEGATIVE
Comment: NORMAL
Neisseria Gonorrhea: NEGATIVE
Trichomonas: POSITIVE — AB

## 2022-03-08 ENCOUNTER — Telehealth: Payer: Self-pay

## 2022-03-08 ENCOUNTER — Encounter: Payer: Self-pay | Admitting: Adult Health

## 2022-03-08 ENCOUNTER — Other Ambulatory Visit: Payer: Self-pay | Admitting: Adult Health

## 2022-03-08 DIAGNOSIS — A599 Trichomoniasis, unspecified: Secondary | ICD-10-CM

## 2022-03-08 HISTORY — DX: Trichomoniasis, unspecified: A59.9

## 2022-03-08 MED ORDER — METRONIDAZOLE 500 MG PO TABS: 500.0000 mg | ORAL_TABLET | Freq: Two times a day (BID) | ORAL | 0 refills | Status: DC

## 2022-03-08 NOTE — Progress Notes (Signed)
+  trich on vaginal swab will rx flagyl, no sex or alcohol during treatment. Partner needs treating too, he can go to his doctor or health dept  ?

## 2022-03-08 NOTE — Telephone Encounter (Signed)
Pharmacy changed to [facility] per pt request. ?

## 2022-05-27 DIAGNOSIS — R42 Dizziness and giddiness: Secondary | ICD-10-CM | POA: Diagnosis not present

## 2022-05-27 DIAGNOSIS — J029 Acute pharyngitis, unspecified: Secondary | ICD-10-CM | POA: Diagnosis not present

## 2022-08-21 ENCOUNTER — Encounter (HOSPITAL_COMMUNITY): Payer: Self-pay

## 2022-08-21 ENCOUNTER — Other Ambulatory Visit: Payer: Self-pay

## 2022-08-21 ENCOUNTER — Emergency Department (HOSPITAL_COMMUNITY)
Admission: EM | Admit: 2022-08-21 | Discharge: 2022-08-21 | Disposition: A | Discharge status: Home / Self Care | Payer: BC Managed Care – PPO | Attending: Emergency Medicine | Admitting: Emergency Medicine

## 2022-08-21 DIAGNOSIS — J069 Acute upper respiratory infection, unspecified: Secondary | ICD-10-CM | POA: Diagnosis not present

## 2022-08-21 DIAGNOSIS — Z20822 Contact with and (suspected) exposure to covid-19: Secondary | ICD-10-CM | POA: Insufficient documentation

## 2022-08-21 DIAGNOSIS — J029 Acute pharyngitis, unspecified: Secondary | ICD-10-CM | POA: Diagnosis not present

## 2022-08-21 DIAGNOSIS — B9789 Other viral agents as the cause of diseases classified elsewhere: Secondary | ICD-10-CM | POA: Diagnosis not present

## 2022-08-21 LAB — GROUP A STREP BY PCR: Group A Strep by PCR: NOT DETECTED

## 2022-08-21 LAB — SARS CORONAVIRUS 2 BY RT PCR: SARS Coronavirus 2 by RT PCR: NEGATIVE

## 2022-08-21 MED ORDER — BENZONATATE 200 MG PO CAPS: 200.0000 mg | ORAL_CAPSULE | Freq: Three times a day (TID) | ORAL | 0 refills | Status: DC | PRN

## 2022-08-21 NOTE — Discharge Instructions (Signed)
You may take Tylenol every 4 hours if needed for body aches and/or fever.  Drink plenty of fluids.  Continue taking the Advil Cold and Sinus as directed.  You have been prescribed Tessalon Perles if needed for cough.  Follow-up with your primary care provider for recheck if needed.

## 2022-08-21 NOTE — ED Provider Notes (Signed)
[facility] EMERGENCY DEPARTMENT Provider Note   CSN: [id number] Arrival date & time: 08/21/22  1542     History  Chief Complaint  Patient presents with   Sore Throat    Diane Parks is a 31 y.o. female.   Sore Throat Pertinent negatives include no chest pain, no abdominal pain, no headaches and no shortness of breath.       Diane Parks is a 31 y.o. female who presents to the Emergency Department complaining of sore throat, body aches, cough, intermittent shaking and chills.  Symptoms began 4 days ago.  Concerned that she has COVID.  Cough is mostly nonproductive.  She has taken over-the-counter cough and cold medication with minimal relief.  Started the medication today.  No known fever.  No recent sick contacts.  She denies any chest pain, shortness of breath, abdominal pain, nausea vomiting or diarrhea.  Home Medications Prior to Admission medications   Medication Sig Start Date End Date Taking? Authorizing Provider  metroNIDAZOLE (FLAGYL) 500 MG tablet Take 1 tablet (500 mg total) by mouth 2 (two) times daily. 03/08/22   Adline Potter, NP  prenatal vitamin w/FE, FA (PRENATAL 1 + 1) 27-1 MG TABS tablet Take 1 tablet by mouth daily at 12 noon. 03/05/22   Adline Potter, NP      Allergies    Patient has no known allergies.    Review of Systems   Review of Systems  Constitutional:  Positive for chills. Negative for fever.  HENT:  Positive for congestion and sore throat.   Respiratory:  Positive for cough. Negative for shortness of breath.   Cardiovascular:  Negative for chest pain.  Gastrointestinal:  Negative for abdominal pain, diarrhea, nausea and vomiting.  Genitourinary:  Negative for dysuria.  Musculoskeletal:  Positive for myalgias. Negative for neck pain and neck stiffness.  Skin:  Negative for rash.  Neurological:  Negative for syncope, weakness and headaches.    Physical Exam Updated Vital Signs BP 118/74 (BP Location: Right Arm)   Pulse  100   Temp 98 F (36.7 C) (Oral)   Resp 16   Ht 5\' 1"  (1.549 m)   Wt 94.3 kg   SpO2 98%   BMI 39.30 kg/m  Physical Exam Vitals and nursing note reviewed.  Constitutional:      General: She is not in acute distress.    Appearance: She is well-developed. She is not ill-appearing or toxic-appearing.  HENT:     Right Ear: Tympanic membrane and ear canal normal.     Left Ear: Tympanic membrane and ear canal normal.     Nose: Nose normal.     Mouth/Throat:     Mouth: Mucous membranes are moist.     Pharynx: Oropharynx is clear. No oropharyngeal exudate or posterior oropharyngeal erythema.  Cardiovascular:     Rate and Rhythm: Normal rate and regular rhythm.     Pulses: Normal pulses.  Pulmonary:     Effort: Pulmonary effort is normal. No respiratory distress.     Breath sounds: No wheezing or rales.  Abdominal:     Palpations: Abdomen is soft.     Tenderness: There is no abdominal tenderness.  Musculoskeletal:        General: Normal range of motion.  Skin:    General: Skin is warm.     Capillary Refill: Capillary refill takes less than 2 seconds.     Findings: No rash.  Neurological:     General: No focal deficit present.  Mental Status: She is alert.     Sensory: No sensory deficit.     Motor: No weakness.     ED Results / Procedures / Treatments   Labs (all labs ordered are listed, but only abnormal results are displayed) Labs Reviewed  GROUP A STREP BY PCR  SARS CORONAVIRUS 2 BY RT PCR    EKG None  Radiology No results found.  Procedures Procedures    Medications Ordered in ED Medications - No data to display  ED Course/ Medical Decision Making/ A&P                           Medical Decision Making Patient here for evaluation of generalized body aches, sore throat, cough and congestion.  Symptoms present for several days.  Patient concerned that she has COVID  On exam, patient nontoxic-appearing.  Vital signs reassuring.  She is afebrile.  No  chest pain or shortness of breath.  PERC negative.  [place] would include but not limited to viral process, COVID, strep throat, pneumonia, bronchitis  Amount and/or Complexity of Data Reviewed Labs: ordered.    Details: Labs interpreted by me, COVID test negative, strep test also negative Discussion of management or test interpretation with external provider(s): Patient here with likely viral process.  Testing today negative.  Lungs are clear to auscultation.  No indication for need of chest x-ray.  And agreeable to symptomatic treatment, fluids and close outpatient follow-up if needed.  Return precautions discussed.           Final Clinical Impression(s) / ED Diagnoses Final diagnoses:  Viral URI with cough    Rx / DC Orders ED Discharge Orders     None         Kem Parkinson, PA-C 08/21/22 1829    Fredia Sorrow, MD 08/22/22 0001

## 2022-08-21 NOTE — ED Triage Notes (Signed)
Pt presents to ED with complaints of sore throat, generalized body aching, cough, congestion, and chills since Saturday.

## 2022-09-12 ENCOUNTER — Encounter: Payer: Self-pay | Admitting: Emergency Medicine

## 2022-09-12 ENCOUNTER — Ambulatory Visit
Admission: EM | Admit: 2022-09-12 | Discharge: 2022-09-12 | Disposition: A | Discharge status: Home / Self Care | Payer: BC Managed Care – PPO | Attending: Family Medicine | Admitting: Family Medicine

## 2022-09-12 ENCOUNTER — Other Ambulatory Visit: Payer: Self-pay

## 2022-09-12 DIAGNOSIS — Z20822 Contact with and (suspected) exposure to covid-19: Secondary | ICD-10-CM | POA: Insufficient documentation

## 2022-09-12 DIAGNOSIS — R509 Fever, unspecified: Secondary | ICD-10-CM | POA: Diagnosis not present

## 2022-09-12 DIAGNOSIS — Z3202 Encounter for pregnancy test, result negative: Secondary | ICD-10-CM | POA: Insufficient documentation

## 2022-09-12 DIAGNOSIS — J069 Acute upper respiratory infection, unspecified: Secondary | ICD-10-CM | POA: Diagnosis not present

## 2022-09-12 LAB — RESP PANEL BY RT-PCR (FLU A&B, COVID) ARPGX2
Influenza A by PCR: NEGATIVE
Influenza B by PCR: NEGATIVE
SARS Coronavirus 2 by RT PCR: NEGATIVE

## 2022-09-12 LAB — POCT URINE PREGNANCY: Preg Test, Ur: NEGATIVE

## 2022-09-12 NOTE — ED Triage Notes (Addendum)
Pt reports family member tested positive for covid last week. Pt reports fever, sore throat, intermittent nausea/diarrhea since Monday.Needs clearance before can return to work.  Pt also reports last period on 10/15 but reports "didn't last as long as usually does" and reports stopped and restarted several times. Would like urine pregnancy.

## 2022-09-12 NOTE — ED Provider Notes (Signed)
[facility]    CSN: [id number] Arrival date & time: 09/12/22  1724      History   Chief Complaint Chief Complaint  Patient presents with   Fever    HPI Diane Parks is a 31 y.o. female.   Presenting today with 4-day history of fever, sore throat, nausea, diarrhea, fatigue.  Denies chest pain, shortness of breath, abdominal pain, nausea vomiting or diarrhea.  Recent exposure to COVID-19.  Taking Tylenol with mild temporary relief of symptoms.  Would also like to take a pregnancy test today as her periods have been irregular, LMP was 08/18/2022 but it was not a typical period for her.    Past Medical History:  Diagnosis Date   Asthma    BV (bacterial vaginosis) 06/20/2015   Chlamydia    Hidradenitis    History of chlamydia 07/20/2015   Missed period 12/06/2015   Obesity    PCB (post coital bleeding) 12/06/2015   Trichimoniasis 03/08/2022   03/08/22 treated with flagyl   UTI (lower urinary tract infection) 06/20/2015   Vaginal discharge 06/20/2015    Patient Active Problem List   Diagnosis Date Noted   Trichimoniasis 03/08/2022   Screening examination for STD (sexually transmitted disease) 03/05/2022   Family planning 03/05/2022   Patient desires pregnancy 03/05/2022   Encounter for well woman exam with routine gynecological exam 03/05/2022   Pregnancy examination or test, negative result 12/17/2019   Encounter for initial prescription of contraceptive pills 12/17/2019   Encounter for menstrual regulation 12/17/2019   Irregular periods 11/30/2019   Missed periods 11/30/2019   Encounter for initial prescription of injectable contraceptive 06/05/2017   Encounter for gynecological examination with Papanicolaou smear of cervix 06/05/2017   PCB (post coital bleeding) 12/06/2015   Missed period 12/06/2015   History of chlamydia 07/20/2015   Vaginal discharge 06/20/2015   BV (bacterial vaginosis) 06/20/2015   UTI (lower urinary tract infection) 06/20/2015    Hidradenitis 03/15/2015   Obesity 03/07/2014   Absence of menstruation 02/18/2013    Past Surgical History:  Procedure Laterality Date   EYE SURGERY     EYE SURGERY      OB History     Gravida  0   Para      Term      Preterm      AB      Living         SAB      IAB      Ectopic      Multiple      Live Births               Home Medications    Prior to Admission medications   Medication Sig Start Date End Date Taking? Authorizing Provider  metroNIDAZOLE (FLAGYL) 500 MG tablet Take 1 tablet (500 mg total) by mouth 2 (two) times daily. 03/08/22   Adline Potter, NP  benzonatate (TESSALON) 200 MG capsule Take 1 capsule (200 mg total) by mouth 3 (three) times daily as needed for cough. Swallow whole, do not chew 08/21/22   Triplett, Tammy, PA-C  prenatal vitamin w/FE, FA (PRENATAL 1 + 1) 27-1 MG TABS tablet Take 1 tablet by mouth daily at 12 noon. 03/05/22   Adline Potter, NP    Family History Family History  Problem Relation Age of Onset   Cancer Maternal Grandmother    Cancer Maternal Grandfather    Cancer Paternal Grandmother    Cancer Paternal Grandfather  Hypertension Mother    Heart disease Mother     Social History Social History   Tobacco Use   Smoking status: Never   Smokeless tobacco: Never  Vaping Use   Vaping Use: Never used  Substance Use Topics   Alcohol use: No   Drug use: No     Allergies   Patient has no known allergies.   Review of Systems Review of Systems PER HPI  Physical Exam Triage Vital Signs ED Triage Vitals  Enc Vitals Group     BP 09/12/22 1817 113/69     Pulse Rate 09/12/22 1817 74     Resp 09/12/22 1817 16     Temp 09/12/22 1817 98.7 F (37.1 C)     Temp Source 09/12/22 1817 Oral     SpO2 09/12/22 1817 98 %     Weight --      Height --      Head Circumference --      Peak Flow --      Pain Score 09/12/22 1837 5     Pain Loc --      Pain Edu? --      Excl. in [place]? --    No data  found.  Updated Vital Signs BP 113/69 (BP Location: Right Arm)   Pulse 74   Temp 98.7 F (37.1 C) (Oral)   Resp 16   LMP 08/18/2022 (Approximate)   SpO2 98%   Visual Acuity Right Eye Distance:   Left Eye Distance:   Bilateral Distance:    Right Eye Near:   Left Eye Near:    Bilateral Near:     Physical Exam Vitals and nursing note reviewed.  Constitutional:      Appearance: Normal appearance. She is not ill-appearing.  HENT:     Head: Atraumatic.     Nose: Nose normal.     Mouth/Throat:     Mouth: Mucous membranes are moist.     Pharynx: Oropharynx is clear. Posterior oropharyngeal erythema present.  Eyes:     Extraocular Movements: Extraocular movements intact.     Conjunctiva/sclera: Conjunctivae normal.  Cardiovascular:     Rate and Rhythm: Normal rate and regular rhythm.     Heart sounds: Normal heart sounds.  Pulmonary:     Effort: Pulmonary effort is normal.     Breath sounds: Normal breath sounds. No wheezing or rales.  Musculoskeletal:        General: Normal range of motion.     Cervical back: Normal range of motion and neck supple.  Skin:    General: Skin is warm and dry.  Neurological:     Mental Status: She is alert and oriented to person, place, and time.     Motor: No weakness.     Gait: Gait normal.  Psychiatric:        Mood and Affect: Mood normal.        Thought Content: Thought content normal.        Judgment: Judgment normal.      UC Treatments / Results  Labs (all labs ordered are listed, but only abnormal results are displayed) Labs Reviewed  RESP PANEL BY RT-PCR (FLU A&B, COVID) ARPGX2  POCT URINE PREGNANCY    EKG   Radiology No results found.  Procedures Procedures (including critical care time)  Medications Ordered in UC Medications - No data to display  Initial Impression / Assessment and Plan / UC Course  I have reviewed the triage vital signs and  the nursing notes.  Pertinent labs & imaging results that were  available during my care of the patient were reviewed by me and considered in my medical decision making (see chart for details).     Vital signs and exam very reassuring today, respiratory panel pending given her exposure and symptoms.  Continue over-the-counter supportive medications and home care.  Urine pregnancy negative.  Final Clinical Impressions(s) / UC Diagnoses   Final diagnoses:  Exposure to COVID-19 virus  Viral URI with cough  Fever, unspecified  Negative pregnancy test   Discharge Instructions   None    ED Prescriptions   None    PDMP not reviewed this encounter.   Volney American, [place] 09/12/22 1924

## 2022-10-01 ENCOUNTER — Emergency Department (HOSPITAL_COMMUNITY)
Admission: EM | Admit: 2022-10-01 | Discharge: 2022-10-01 | Disposition: A | Discharge status: Home / Self Care | Payer: BC Managed Care – PPO | Attending: Emergency Medicine | Admitting: Emergency Medicine

## 2022-10-01 ENCOUNTER — Encounter (HOSPITAL_COMMUNITY): Payer: Self-pay

## 2022-10-01 ENCOUNTER — Other Ambulatory Visit: Payer: Self-pay

## 2022-10-01 DIAGNOSIS — B349 Viral infection, unspecified: Secondary | ICD-10-CM | POA: Insufficient documentation

## 2022-10-01 DIAGNOSIS — R000 Tachycardia, unspecified: Secondary | ICD-10-CM | POA: Insufficient documentation

## 2022-10-01 DIAGNOSIS — R509 Fever, unspecified: Secondary | ICD-10-CM

## 2022-10-01 DIAGNOSIS — Z1152 Encounter for screening for COVID-19: Secondary | ICD-10-CM | POA: Insufficient documentation

## 2022-10-01 DIAGNOSIS — R0602 Shortness of breath: Secondary | ICD-10-CM | POA: Diagnosis not present

## 2022-10-01 LAB — RESP PANEL BY RT-PCR (FLU A&B, COVID) ARPGX2
Influenza A by PCR: NEGATIVE
Influenza B by PCR: NEGATIVE
SARS Coronavirus 2 by RT PCR: NEGATIVE

## 2022-10-01 MED ORDER — IBUPROFEN 400 MG PO TABS
600.0000 mg | ORAL_TABLET | Freq: Once | ORAL | Status: AC
  Administered 2022-10-01: 600 mg via ORAL
  Filled 2022-10-01: qty 2

## 2022-10-01 NOTE — Discharge Instructions (Addendum)
It was a pleasure taking care of you today!  Your COVID and flu swab was negative today.  You may take over-the-counter cough and cold medications for your symptoms.  Ensure to maintain fluid intake.  You may follow-up with your primary care provider as needed.  Return to the emergency department if you experience increasing/worsening symptoms.

## 2022-10-01 NOTE — ED Provider Notes (Signed)
[facility] EMERGENCY DEPARTMENT Provider Note   CSN: [id number] Arrival date & time: 10/01/22  1504     History  Chief Complaint  Patient presents with   Fever    Diane Parks is a 31 y.o. female presents emergency department with concerns for subjective fever onset yesterday.  Denies sick contacts.  Patient has associated headache, generalized bodyaches, shortness of breath.  Has tried over-the-counter medications for symptoms.  Denies nausea, vomiting, abdominal pain, urinary symptoms, chest pain, cough.  The history is provided by the patient. No language interpreter was used.       Home Medications Prior to Admission medications   Medication Sig Start Date End Date Taking? Authorizing Provider  metroNIDAZOLE (FLAGYL) 500 MG tablet Take 1 tablet (500 mg total) by mouth 2 (two) times daily. 03/08/22   Adline Potter, NP  benzonatate (TESSALON) 200 MG capsule Take 1 capsule (200 mg total) by mouth 3 (three) times daily as needed for cough. Swallow whole, do not chew 08/21/22   Triplett, Tammy, PA-C  prenatal vitamin w/FE, FA (PRENATAL 1 + 1) 27-1 MG TABS tablet Take 1 tablet by mouth daily at 12 noon. 03/05/22   Adline Potter, NP      Allergies    Patient has no known allergies.    Review of Systems   Review of Systems  Constitutional:  Positive for fever.  All other systems reviewed and are negative.   Physical Exam Updated Vital Signs BP 104/60   Pulse (!) 108   Temp (!) 100.4 F (38 C) (Oral)   Resp 20   Ht 5\' 1"  (1.549 m)   Wt 95.3 kg   LMP 09/24/2022 (Approximate)   SpO2 94%   BMI 39.68 kg/m  Physical Exam Vitals and nursing note reviewed.  Constitutional:      General: She is not in acute distress.    Appearance: Normal appearance.  HENT:     Mouth/Throat:     Comments: Uvula midline without swelling. No posterior pharyngeal erythema or tonsillar exudate noted. Patent airway. Pt able to speak in clear complete sentences. Tolerating oral  secretions. Eyes:     General: No scleral icterus.    Extraocular Movements: Extraocular movements intact.  Neck:     Comments: Full active ROM of neck without difficulty.  Cardiovascular:     Rate and Rhythm: Regular rhythm. Tachycardia present.     Pulses: Normal pulses.     Heart sounds: Normal heart sounds.  Pulmonary:     Effort: Pulmonary effort is normal. No respiratory distress.     Breath sounds: Normal breath sounds.  Abdominal:     Palpations: Abdomen is soft. There is no mass.     Tenderness: There is no abdominal tenderness.  Musculoskeletal:        General: Normal range of motion.     Cervical back: Full passive range of motion without pain and neck supple.  Skin:    General: Skin is warm and dry.     Findings: No rash.  Neurological:     Mental Status: She is alert.     Sensory: Sensation is intact.     Motor: Motor function is intact.  Psychiatric:        Behavior: Behavior normal.     ED Results / Procedures / Treatments   Labs (all labs ordered are listed, but only abnormal results are displayed) Labs Reviewed  RESP PANEL BY RT-PCR (FLU A&B, COVID) ARPGX2    EKG None  Radiology No results found.  Procedures Procedures    Medications Ordered in ED Medications  ibuprofen (ADVIL) tablet 600 mg (600 mg Oral Given 10/01/22 1623)    ED Course/ Medical Decision Making/ A&P Clinical Course as of 10/01/22 1819  Tue Oct 01, 2022  1654 Re-evaluated and patient resting comfortably on stretcher. Pt without PE risk factors at this time. Tachycardia likely in the setting of fever in ED.  [SB]  1756 Pt re-evaluated and temp obtained by myself, temperature improved to 99.7, heart rate improved to 100. [SB]  1806 Offered patient IVF at this time to aid with heart rate, patient declines at this time. Pt appears safe for discharge. [SB]    Clinical Course User Index [SB] Zayah Keilman A, PA-C                           Medical Decision  Making Risk Prescription drug management.   Pt presents with concerns for fever and generalized body aches onset last night. Tried OTC meds PTA. Denies sick contacts. Pt afebrile. Slightly tachycardic on exam. No acute respiratory exam findings. Differential diagnosis includes COVID, flu, viral URI, meningitis.    Labs:  I ordered, and personally interpreted labs.  The pertinent results include:   COVID swab negative Flu swab negative  Medications:  I ordered medication including ibuprofen for fever management Reevaluation of the patient after these medicines and interventions, I reevaluated the patient and found that they have improved I have reviewed the patients home medicines and have made adjustments as needed  Disposition: Presentation suspicious for viral etiology as cause of patients symptoms. Doubt COVID or flu at this time. Doubt concerns for meningitis at this time. After consideration of the diagnostic results and the patients response to treatment, I feel that the patient would benefit from Discharge home. Work note provided. Supportive care measures and strict return precautions discussed with patient at bedside. Pt acknowledges and verbalizes understanding. Pt appears safe for discharge. Follow up as indicated in discharge paperwork.    This chart was dictated using voice recognition software, Dragon. Despite the best efforts of this provider to proofread and correct errors, errors may still occur which can change documentation meaning.   Final Clinical Impression(s) / ED Diagnoses Final diagnoses:  Viral illness  Fever, unspecified fever cause    Rx / DC Orders ED Discharge Orders     None         Blythe Veach A, PA-C 10/01/22 1819    Eber Hong, MD 10/01/22 2023

## 2022-10-01 NOTE — ED Triage Notes (Signed)
Pt presents with subjective fever, HA, ShOB that started last night.

## 2022-10-31 DIAGNOSIS — R309 Painful micturition, unspecified: Secondary | ICD-10-CM | POA: Diagnosis not present

## 2022-10-31 DIAGNOSIS — R109 Unspecified abdominal pain: Secondary | ICD-10-CM | POA: Diagnosis not present

## 2022-10-31 DIAGNOSIS — Z5321 Procedure and treatment not carried out due to patient leaving prior to being seen by health care provider: Secondary | ICD-10-CM | POA: Diagnosis not present

## 2022-11-01 ENCOUNTER — Other Ambulatory Visit: Payer: Self-pay

## 2022-11-01 ENCOUNTER — Emergency Department (HOSPITAL_COMMUNITY)
Admission: EM | Admit: 2022-11-01 | Discharge: 2022-11-01 | Discharge status: Against Medical Advice | Payer: BC Managed Care – PPO | Attending: Emergency Medicine | Admitting: Emergency Medicine

## 2022-11-01 ENCOUNTER — Encounter (HOSPITAL_COMMUNITY): Payer: Self-pay

## 2022-11-01 LAB — URINALYSIS, ROUTINE W REFLEX MICROSCOPIC
Bilirubin Urine: NEGATIVE
Glucose, UA: NEGATIVE mg/dL
Hgb urine dipstick: NEGATIVE
Ketones, ur: NEGATIVE mg/dL
Nitrite: NEGATIVE
Protein, ur: NEGATIVE mg/dL
Specific Gravity, Urine: 1.019 (ref 1.005–1.030)
pH: 7 (ref 5.0–8.0)

## 2022-11-01 NOTE — ED Triage Notes (Signed)
Pov from home. Cc of burning when she urinates + increased frequency and abdominal pain.

## 2022-11-06 ENCOUNTER — Ambulatory Visit (INDEPENDENT_AMBULATORY_CARE_PROVIDER_SITE_OTHER): Payer: BC Managed Care – PPO

## 2022-11-06 VITALS — BP 122/77 | HR 90 | Ht 61.0 in | Wt 198.1 lb

## 2022-11-06 DIAGNOSIS — Z3201 Encounter for pregnancy test, result positive: Secondary | ICD-10-CM | POA: Diagnosis not present

## 2022-11-06 LAB — POCT URINE PREGNANCY: Preg Test, Ur: POSITIVE — AB

## 2022-11-06 NOTE — Progress Notes (Signed)
   NURSE VISIT- PREGNANCY CONFIRMATION   SUBJECTIVE:  Cairo Lingenfelter is a 32 y.o. G1P0 female at [redacted]w[redacted]d by certain LMP of Patient's last menstrual period was 09/19/2022 (exact date). Here for pregnancy confirmation.  Home pregnancy test: positive x 11   She reports nausea.  She is taking prenatal vitamins.    OBJECTIVE:  BP 122/77 (BP Location: Right Arm, Patient Position: Sitting, Cuff Size: Normal)   Pulse 90   Ht 5\' 1"  (1.549 m)   Wt 198 lb 2 oz (89.9 kg)   LMP 09/19/2022 (Exact Date)   BMI 37.44 kg/m   Appears well, in no apparent distress  Results for orders placed or performed in visit on 11/06/22 (from the past 24 hour(s))  POCT urine pregnancy   Collection Time: 11/06/22 10:13 AM  Result Value Ref Range   Preg Test, Ur Positive (A) Negative    ASSESSMENT: Positive pregnancy test, [redacted]w[redacted]d by LMP    PLAN: Schedule for dating ultrasound in 8 days Prenatal vitamins: continue   Nausea medicines: not currently needed   OB packet given: Yes  Andrez Grime  11/06/2022 10:21 AM

## 2022-11-11 DIAGNOSIS — O99511 Diseases of the respiratory system complicating pregnancy, first trimester: Secondary | ICD-10-CM | POA: Diagnosis not present

## 2022-11-11 DIAGNOSIS — O99611 Diseases of the digestive system complicating pregnancy, first trimester: Secondary | ICD-10-CM | POA: Diagnosis not present

## 2022-11-11 DIAGNOSIS — J020 Streptococcal pharyngitis: Secondary | ICD-10-CM | POA: Diagnosis not present

## 2022-11-11 DIAGNOSIS — K0889 Other specified disorders of teeth and supporting structures: Secondary | ICD-10-CM | POA: Diagnosis not present

## 2022-11-14 ENCOUNTER — Other Ambulatory Visit: Payer: Self-pay | Admitting: Obstetrics & Gynecology

## 2022-11-14 DIAGNOSIS — O3680X0 Pregnancy with inconclusive fetal viability, not applicable or unspecified: Secondary | ICD-10-CM

## 2022-11-15 ENCOUNTER — Other Ambulatory Visit: Payer: Self-pay | Admitting: Obstetrics & Gynecology

## 2022-11-15 ENCOUNTER — Encounter: Payer: BC Managed Care – PPO | Admitting: *Deleted

## 2022-11-15 ENCOUNTER — Other Ambulatory Visit: Payer: BC Managed Care – PPO

## 2022-11-15 ENCOUNTER — Ambulatory Visit (INDEPENDENT_AMBULATORY_CARE_PROVIDER_SITE_OTHER): Payer: BC Managed Care – PPO

## 2022-11-15 DIAGNOSIS — O3680X0 Pregnancy with inconclusive fetal viability, not applicable or unspecified: Secondary | ICD-10-CM

## 2022-11-15 DIAGNOSIS — Z3A08 8 weeks gestation of pregnancy: Secondary | ICD-10-CM

## 2022-11-15 NOTE — Progress Notes (Signed)
[place] 5+1 wks,gestational sac,no yolk sac,GS 4.4 mm,arcuate uterus,normal ovaries,labs and f/u ultrasound 2 wks per Derrek Monaco

## 2022-11-16 LAB — BETA HCG QUANT (REF LAB): hCG Quant: 1739 m[IU]/mL

## 2022-11-18 ENCOUNTER — Other Ambulatory Visit: Payer: BC Managed Care – PPO

## 2022-11-20 ENCOUNTER — Telehealth: Payer: Self-pay | Admitting: *Deleted

## 2022-11-20 ENCOUNTER — Other Ambulatory Visit: Payer: Self-pay

## 2022-11-20 ENCOUNTER — Emergency Department (HOSPITAL_COMMUNITY)
Admission: EM | Admit: 2022-11-20 | Discharge: 2022-11-21 | Disposition: A | Discharge status: Home / Self Care | Payer: BC Managed Care – PPO | Attending: Emergency Medicine | Admitting: Emergency Medicine

## 2022-11-20 DIAGNOSIS — O034 Incomplete spontaneous abortion without complication: Secondary | ICD-10-CM | POA: Diagnosis not present

## 2022-11-20 DIAGNOSIS — R7309 Other abnormal glucose: Secondary | ICD-10-CM | POA: Diagnosis not present

## 2022-11-20 DIAGNOSIS — R7301 Impaired fasting glucose: Secondary | ICD-10-CM | POA: Diagnosis not present

## 2022-11-20 DIAGNOSIS — O039 Complete or unspecified spontaneous abortion without complication: Secondary | ICD-10-CM | POA: Insufficient documentation

## 2022-11-20 DIAGNOSIS — Z3A00 Weeks of gestation of pregnancy not specified: Secondary | ICD-10-CM | POA: Diagnosis not present

## 2022-11-20 DIAGNOSIS — R9389 Abnormal findings on diagnostic imaging of other specified body structures: Secondary | ICD-10-CM | POA: Diagnosis not present

## 2022-11-20 DIAGNOSIS — O209 Hemorrhage in early pregnancy, unspecified: Secondary | ICD-10-CM | POA: Diagnosis not present

## 2022-11-20 NOTE — ED Triage Notes (Signed)
Pt states she had spotting earlier and heavy bleeding with clotting stated at 1030. This is the pt first pregnancy and she see [facility] for care. Pt states she is about [redacted] weeks pregnant.

## 2022-11-20 NOTE — Telephone Encounter (Signed)
Pt is pregnant; will be 6 weeks tomorrow. She is at work. She had some light spotting and light cramping. Pt had sex Saturday or Sunday pm and had BM yesterday. Pt was advised to watch it for now. If spotting got heavier or if cramping gets worse, let us know if office is open or call the after hours nurse line. Pt advised not to lift greater than 25 lbs. Keep scheduled appt. Call with any questions or concerns. Pt voiced understanding. [place]

## 2022-11-21 ENCOUNTER — Emergency Department (HOSPITAL_COMMUNITY): Payer: BC Managed Care – PPO

## 2022-11-21 ENCOUNTER — Telehealth: Payer: Self-pay | Admitting: *Deleted

## 2022-11-21 ENCOUNTER — Encounter (HOSPITAL_COMMUNITY): Payer: Self-pay | Admitting: Emergency Medicine

## 2022-11-21 DIAGNOSIS — O209 Hemorrhage in early pregnancy, unspecified: Secondary | ICD-10-CM | POA: Diagnosis not present

## 2022-11-21 DIAGNOSIS — Z3A00 Weeks of gestation of pregnancy not specified: Secondary | ICD-10-CM | POA: Diagnosis not present

## 2022-11-21 DIAGNOSIS — R9389 Abnormal findings on diagnostic imaging of other specified body structures: Secondary | ICD-10-CM | POA: Diagnosis not present

## 2022-11-21 LAB — WET PREP, GENITAL
Sperm: NONE SEEN
Trich, Wet Prep: NONE SEEN
WBC, Wet Prep HPF POC: <10 (ref <10)
Yeast Wet Prep HPF POC: NONE SEEN

## 2022-11-21 LAB — CBC WITH DIFFERENTIAL/PLATELET
Abs Immature Granulocytes: 0.05 10*3/uL (ref 0.00–0.07)
Basophils Absolute: 0 10*3/uL (ref 0.0–0.1)
Basophils Relative: 0 %
Eosinophils Absolute: 0.2 10*3/uL (ref 0.0–0.5)
Eosinophils Relative: 2 %
HCT: 40 % (ref 36.0–46.0)
Hemoglobin: 12.6 g/dL (ref 12.0–15.0)
Immature Granulocytes: 0 %
Lymphocytes Relative: 26 %
Lymphs Abs: 3.7 10*3/uL (ref 0.7–4.0)
MCH: 27.3 pg (ref 26.0–34.0)
MCHC: 31.5 g/dL (ref 30.0–36.0)
MCV: 86.8 fL (ref 80.0–100.0)
Monocytes Absolute: 1.1 10*3/uL — ABNORMAL HIGH (ref 0.1–1.0)
Monocytes Relative: 8 %
Neutro Abs: 9.1 10*3/uL — ABNORMAL HIGH (ref 1.7–7.7)
Neutrophils Relative %: 64 %
Platelets: 378 10*3/uL (ref 150–400)
RBC: 4.61 MIL/uL (ref 3.87–5.11)
RDW: 14.2 % (ref 11.5–15.5)
WBC: 14.2 10*3/uL — ABNORMAL HIGH (ref 4.0–10.5)
nRBC: 0 % (ref 0.0–0.2)

## 2022-11-21 LAB — BASIC METABOLIC PANEL
Anion gap: 11 (ref 5–15)
BUN: 10 mg/dL (ref 6–20)
CO2: 24 mmol/L (ref 22–32)
Calcium: 8.9 mg/dL (ref 8.9–10.3)
Chloride: 100 mmol/L (ref 98–111)
Creatinine, Ser: 0.62 mg/dL (ref 0.44–1.00)
GFR, Estimated: >60 mL/min (ref >60)
Glucose, Bld: 118 mg/dL — ABNORMAL HIGH (ref 70–99)
Potassium: 3.7 mmol/L (ref 3.5–5.1)
Sodium: 135 mmol/L (ref 135–145)

## 2022-11-21 LAB — HIV ANTIBODY (ROUTINE TESTING W REFLEX): HIV Screen 4th Generation wRfx: NONREACTIVE

## 2022-11-21 LAB — URINALYSIS, ROUTINE W REFLEX MICROSCOPIC
Bacteria, UA: NONE SEEN
Bilirubin Urine: NEGATIVE
Glucose, UA: NEGATIVE mg/dL
Ketones, ur: NEGATIVE mg/dL
Leukocytes,Ua: NEGATIVE
Nitrite: NEGATIVE
Protein, ur: 100 mg/dL — AB
RBC / HPF: >50 RBC/hpf — ABNORMAL HIGH (ref 0–5)
Specific Gravity, Urine: 1.026 (ref 1.005–1.030)
pH: 6 (ref 5.0–8.0)

## 2022-11-21 LAB — RPR: RPR Ser Ql: NONREACTIVE

## 2022-11-21 LAB — ABO/RH: ABO/RH(D): O POS

## 2022-11-21 LAB — HCG, QUANTITATIVE, PREGNANCY: hCG, Beta Chain, Quant, S: 2553 m[IU]/mL — ABNORMAL HIGH (ref <5)

## 2022-11-21 NOTE — Discharge Instructions (Signed)
You may take acetaminophen and/your ibuprofen as needed for any cramping you may have.  If bleeding or cramping are severe, you are welcome to return for reevaluation at any time.

## 2022-11-21 NOTE — Telephone Encounter (Signed)
Pt is bleeding. Went to [facility] ER yesterday. Pt is early pregnant. Had [place] @ ER and it was noted gestational sac no longer visualized. Pt states she has a pic with gestational sac and baby. Pt to send pic through mychart. [place]

## 2022-11-21 NOTE — ED Provider Notes (Signed)
[facility] EMERGENCY DEPARTMENT Provider Note   CSN: [id number] Arrival date & time: 11/20/22  2314     History  Chief Complaint  Patient presents with   Vaginal Bleeding    Diane Parks is a 32 y.o. female.  The history is provided by the patient.  Vaginal Bleeding She is pregnant with her first pregnancy and had some light spotting earlier today and heavier bleeding with clots at 10:30 PM.  There has been some mild abdominal cramping.  Bleeding is less than her normal menses.  Last menstrual period was 09/19/2022.  She had hCG and ultrasound done on 11/15/2022.  hCG was about 1700, ultrasound showed no yolk sac or heartbeat and repeat ultrasound had been ordered.   Home Medications Prior to Admission medications   Medication Sig Start Date End Date Taking? Authorizing Provider  metroNIDAZOLE (FLAGYL) 500 MG tablet Take 1 tablet (500 mg total) by mouth 2 (two) times daily. Patient not taking: Reported on 11/06/2022 03/08/22   Adline Potter, NP  benzonatate (TESSALON) 200 MG capsule Take 1 capsule (200 mg total) by mouth 3 (three) times daily as needed for cough. Swallow whole, do not chew Patient not taking: Reported on 11/06/2022 08/21/22   Triplett, Tammy, PA-C  prenatal vitamin w/FE, FA (PRENATAL 1 + 1) 27-1 MG TABS tablet Take 1 tablet by mouth daily at 12 noon. 03/05/22   Adline Potter, NP      Allergies    Patient has no known allergies.    Review of Systems   Review of Systems  Genitourinary:  Positive for vaginal bleeding.  All other systems reviewed and are negative.   Physical Exam Updated Vital Signs BP 117/74   Pulse 91   Temp 98 F (36.7 C)   Resp 17   Ht 5\' 1"  (1.549 m)   Wt 90.7 kg   LMP 09/19/2022 (Exact Date)   SpO2 98%   BMI 37.79 kg/m  Physical Exam Vitals and nursing note reviewed.   32 year old female, resting comfortably and in no acute distress. Vital signs are normal. Oxygen saturation is 98%, which is normal. Head is  normocephalic and atraumatic. PERRLA, EOMI.  Neck is nontender and supple. Back is nontender and there is no CVA tenderness. Lungs are clear without rales, wheezes, or rhonchi. Chest is nontender. Heart has regular rate and rhythm without murmur. Abdomen is soft, flat, nontender. Pelvic: Normal external female genitalia.  A few small clots are present in the vaginal vault including 1 in the cervix but the cervix is otherwise closed and no active bleeding.  Fundus is approximately 6 weeks size.  There are no adnexal masses or tenderness. Extremities have no cyanosis or edema, full range of motion is present. Skin is warm and dry without rash. Neurologic: Mental status is normal, cranial nerves are intact, moves all extremities equally.  ED Results / Procedures / Treatments   Labs (all labs ordered are listed, but only abnormal results are displayed) Labs Reviewed  WET PREP, GENITAL - Abnormal; Notable for the following components:      Result Value   Clue Cells Wet Prep HPF POC PRESENT (*)    All other components within normal limits  BASIC METABOLIC PANEL - Abnormal; Notable for the following components:   Glucose, Bld 118 (*)    All other components within normal limits  CBC WITH DIFFERENTIAL/PLATELET - Abnormal; Notable for the following components:   WBC 14.2 (*)    Neutro Abs 9.1 (*)  Monocytes Absolute 1.1 (*)    All other components within normal limits  HCG, QUANTITATIVE, PREGNANCY - Abnormal; Notable for the following components:   hCG, Beta Chain, Quant, S 2,553 (*)    All other components within normal limits  URINALYSIS, ROUTINE W REFLEX MICROSCOPIC - Abnormal; Notable for the following components:   Color, Urine AMBER (*)    APPearance CLOUDY (*)    Hgb urine dipstick LARGE (*)    Protein, ur 100 (*)    RBC / HPF >50 (*)    All other components within normal limits  RPR  HIV ANTIBODY (ROUTINE TESTING W REFLEX)  ABO/RH  GC/CHLAMYDIA PROBE AMP ([place]) NOT AT  [facility]    Radiology US OB LESS THAN 14 WEEKS WITH OB TRANSVAGINAL  Result Date: 11/21/2022 CLINICAL DATA:  First trimester bleeding. EXAM: OBSTETRIC <14 WK [place] AND TRANSVAGINAL OB US TECHNIQUE: Both transabdominal and transvaginal ultrasound examinations were performed for complete evaluation of the gestation as well as the maternal uterus, adnexal regions, and pelvic cul-de-sac. Transvaginal technique was performed to assess early pregnancy. COMPARISON:  11/15/2022 FINDINGS: Intrauterine gestational sac: None Yolk sac:  Not Visualized. Embryo:  Not Visualized. Cardiac Activity: Not Visualized. Heart Rate: NA  bpm Maternal uterus/adnexae: Right ovary: Normal. Left ovary: Not visualized due to overlying bowel gas. Other :The endometrium appears heterogeneous measuring up to 11.3 mm in thickness. Elongated fluid collection within the lower uterine segment/cervix is identified. Free fluid:  Trace free fluid noted within the pelvis. IMPRESSION: 1. The previously noted gestational sac is no longer visualized. Findings are concerning for abortion in progress. 2. Thickened and heterogeneous endometrium measures 11.3 mm. Retained products of conception can be suspected on ultrasound if the endometrial thickness is >10 mm following spontaneous abortion. Electronically Signed   By: Kerby Moors M.D.   On: 11/21/2022 05:44    Procedures Procedures    Medications Ordered in ED Medications - No data to display  ED Course/ Medical Decision Making/ A&P                             Medical Decision Making Amount and/or Complexity of Data Reviewed Labs: ordered. Radiology: ordered.   First trimester bleeding.  Differential diagnosis includes miscarriage, ectopic pregnancy, subchorionic hemorrhage, threatened miscarriage.  I have reviewed her past records, and nursing visit for pregnancy confirmation on 11/06/2022 estimated she was 6 weeks 6 days pregnant.  On 1/12, when by dates she would be 8 weeks 2 days  pregnant, ultrasound showed she was 5 weeks 1 day pregnant with gestational sac but no yolk sac.  hCG level was 1739.  I am concerned that a normal pregnancy with hCG level that high there should have been more visible.  I have ordered repeat hCG level as well as blood typing and will order another ultrasound.  I have reviewed and interpreted her laboratory tests, and my interpretation is elevated random glucose level, mild leukocytosis which is nonspecific, hCG level 2553 which is considerably lower than what would have been expected based on prior hCG and time between tests.  Clue cells noted on wet prep but patient does not clinically have bacterial vaginosis, no treatment needed.  Urinalysis is significant for greater than 50 RBCs and positive protein felt likely to be secondary to vaginal contamination.  Pelvic ultrasound shows gestational sac no longer visualized consistent with miscarriage.  Thickened endometrium likely on expelled products of conception.  I have informed  patient of these findings.  I have recommended follow-up with her gynecologist in 1 week.  Blood type is O+, no indication for RhoGAM administration.  Final Clinical Impression(s) / ED Diagnoses Final diagnoses:  Inevitable complete miscarriage without complication  Elevated random blood glucose level    Rx / DC Orders ED Discharge Orders     None         Delora Fuel, MD 16/38/46 [phone]

## 2022-11-22 LAB — GC/CHLAMYDIA PROBE AMP (~~LOC~~) NOT AT ARMC
Chlamydia: NEGATIVE
Comment: NEGATIVE
Comment: NORMAL
Neisseria Gonorrhea: NEGATIVE

## 2022-11-23 DIAGNOSIS — O208 Other hemorrhage in early pregnancy: Secondary | ICD-10-CM | POA: Diagnosis not present

## 2022-11-23 DIAGNOSIS — O209 Hemorrhage in early pregnancy, unspecified: Secondary | ICD-10-CM | POA: Diagnosis not present

## 2022-11-23 DIAGNOSIS — Z3A01 Less than 8 weeks gestation of pregnancy: Secondary | ICD-10-CM | POA: Diagnosis not present

## 2022-11-26 ENCOUNTER — Telehealth: Payer: Self-pay | Admitting: Adult Health

## 2022-11-26 NOTE — Telephone Encounter (Signed)
Will you look into this please it looks like patient was seen in the ED, and HCG orders where put in, do we know if the ultra sound for the 30th is still needed?

## 2022-12-03 ENCOUNTER — Other Ambulatory Visit: Payer: BC Managed Care – PPO

## 2022-12-03 ENCOUNTER — Ambulatory Visit: Payer: BC Managed Care – PPO | Admitting: Adult Health

## 2023-01-02 ENCOUNTER — Encounter: Payer: Self-pay | Admitting: Radiology

## 2023-04-08 ENCOUNTER — Ambulatory Visit
Admission: RE | Admit: 2023-04-08 | Discharge: 2023-04-08 | Disposition: A | Discharge status: Home / Self Care | Payer: Self-pay | Source: Ambulatory Visit | Attending: Nurse Practitioner | Admitting: Nurse Practitioner

## 2023-04-08 VITALS — BP 116/76 | HR 100 | Temp 99.2°F | Resp 16

## 2023-04-08 DIAGNOSIS — R519 Headache, unspecified: Secondary | ICD-10-CM

## 2023-04-08 MED ORDER — SUMATRIPTAN SUCCINATE 50 MG PO TABS: 50.0000 mg | ORAL_TABLET | Freq: Once | ORAL | 0 refills | Status: DC

## 2023-04-08 NOTE — Discharge Instructions (Signed)
Take the Sumatriptan for the headache.  Follow up with PCP if symptoms recur despite treatment for further evaluation and management of ongoing headaches.

## 2023-04-08 NOTE — ED Provider Notes (Signed)
[facility]    CSN: [id number] Arrival date & time: 04/08/23  1315      History   Chief Complaint Chief Complaint  Patient presents with   Headache    Entered by patient    HPI Diane Parks is a 32 y.o. female.   Patient presents today for 2 week history of worsening headaches.  Reports current headache has been ongoing since yesterday and feels like pressure in the front of her head. Currently headache is a 6/10 and denies worst headache of her life.  Denies radiation of pain, nausea, vomiting, recent head injury, accident, fall, or trauma involving the head.  Also denies confusion, blurred or double vision, gait disturbance or ataxia, recent falls, behavioral changes, fever, cough, congestion, and sore throat.  No upper extremity or lower extremity weakness, trouble swallowing, or trouble with speech.  Endorses some photophobia with the headache, currently has a headache, has had to miss work because of the headache, endorses lightheadedness/room spinning sensation with head movements or bending over.  Has been taking ibuprofen and Tylenol which do not really help with symptoms.  Reports napping seems to help.  She denies history of migraines, does have history of occasional headaches but never this frequently.  Does not currently have a PCP.    Past Medical History:  Diagnosis Date   Asthma    BV (bacterial vaginosis) 06/20/2015   Chlamydia    Hidradenitis    History of chlamydia 07/20/2015   Missed period 12/06/2015   Obesity    PCB (post coital bleeding) 12/06/2015   Trichimoniasis 03/08/2022   03/08/22 treated with flagyl   UTI (lower urinary tract infection) 06/20/2015   Vaginal discharge 06/20/2015    Patient Active Problem List   Diagnosis Date Noted   Trichimoniasis 03/08/2022   Screening examination for STD (sexually transmitted disease) 03/05/2022   Family planning 03/05/2022   Patient desires pregnancy 03/05/2022   Encounter for well woman exam with  routine gynecological exam 03/05/2022   Pregnancy examination or test, negative result 12/17/2019   Encounter for initial prescription of contraceptive pills 12/17/2019   Encounter for menstrual regulation 12/17/2019   Irregular periods 11/30/2019   Missed periods 11/30/2019   Encounter for initial prescription of injectable contraceptive 06/05/2017   Encounter for gynecological examination with Papanicolaou smear of cervix 06/05/2017   PCB (post coital bleeding) 12/06/2015   Missed period 12/06/2015   History of chlamydia 07/20/2015   Vaginal discharge 06/20/2015   BV (bacterial vaginosis) 06/20/2015   UTI (lower urinary tract infection) 06/20/2015   Hidradenitis 03/15/2015   Obesity 03/07/2014   Absence of menstruation 02/18/2013    Past Surgical History:  Procedure Laterality Date   EYE SURGERY     EYE SURGERY      OB History     Gravida  1   Para      Term      Preterm      AB      Living         SAB      IAB      Ectopic      Multiple      Live Births               Home Medications    Prior to Admission medications   Medication Sig Start Date End Date Taking? Authorizing Provider  SUMAtriptan (IMITREX) 50 MG tablet Take 1 tablet (50 mg total) by mouth once for 1 dose. May repeat  1 dose 2 hours after first dose if headache persists or recurs.  Do not exceed more than 2 doses in 24 hour period. 04/08/23 04/08/23 Yes Valentino Nose, NP  prenatal vitamin w/FE, FA (PRENATAL 1 + 1) 27-1 MG TABS tablet Take 1 tablet by mouth daily at 12 noon. 03/05/22   Adline Potter, NP    Family History Family History  Problem Relation Age of Onset   Cancer Maternal Grandmother    Cancer Maternal Grandfather    Cancer Paternal Grandmother    Cancer Paternal Grandfather    Hypertension Mother    Heart disease Mother     Social History Social History   Tobacco Use   Smoking status: Never   Smokeless tobacco: Never  Vaping Use   Vaping Use: Never  used  Substance Use Topics   Alcohol use: No   Drug use: No     Allergies   Patient has no known allergies.   Review of Systems Review of Systems Per HPI  Physical Exam Triage Vital Signs ED Triage Vitals  Enc Vitals Group     BP 04/08/23 1319 116/76     Pulse Rate 04/08/23 1319 100     Resp 04/08/23 1319 16     Temp 04/08/23 1319 99.2 F (37.3 C)     Temp Source 04/08/23 1319 Oral     SpO2 04/08/23 1319 96 %     Weight --      Height --      Head Circumference --      Peak Flow --      Pain Score 04/08/23 1320 6     Pain Loc --      Pain Edu? --      Excl. in GC? --    No data found.  Updated Vital Signs BP 116/76 (BP Location: Right Arm)   Pulse 100   Temp 99.2 F (37.3 C) (Oral)   Resp 16   LMP 04/08/2023 (Exact Date)   SpO2 96%   Breastfeeding Unknown   Visual Acuity Right Eye Distance:   Left Eye Distance:   Bilateral Distance:    Right Eye Near:   Left Eye Near:    Bilateral Near:     Physical Exam Vitals and nursing note reviewed.  Constitutional:      General: She is not in acute distress.    Appearance: Normal appearance. She is not toxic-appearing.  HENT:     Head: Normocephalic and atraumatic.     Right Ear: Tympanic membrane, ear canal and external ear normal. There is no impacted cerumen.     Left Ear: Tympanic membrane, ear canal and external ear normal. There is no impacted cerumen.     Nose: Nose normal. No congestion or rhinorrhea.     Mouth/Throat:     Mouth: Mucous membranes are moist.     Pharynx: Oropharynx is clear.  Eyes:     General: No scleral icterus.       Right eye: No discharge.        Left eye: No discharge.     Extraocular Movements: Extraocular movements intact.     Right eye: Normal extraocular motion.     Left eye: Normal extraocular motion.     Conjunctiva/sclera: Conjunctivae normal.     Pupils: Pupils are equal, round, and reactive to light.  Cardiovascular:     Rate and Rhythm: Normal rate and regular  rhythm.  Pulmonary:     Effort: Pulmonary  effort is normal. No respiratory distress.     Breath sounds: No wheezing, rhonchi or rales.  Musculoskeletal:     Cervical back: Normal range of motion.  Lymphadenopathy:     Cervical: No cervical adenopathy.  Skin:    General: Skin is warm and dry.     Capillary Refill: Capillary refill takes less than 2 seconds.     Coloration: Skin is not jaundiced or pale.  Neurological:     General: No focal deficit present.     Mental Status: She is alert and oriented to person, place, and time.     Cranial Nerves: Cranial nerves 2-12 are intact. No cranial nerve deficit.     Sensory: Sensation is intact. No sensory deficit.     Motor: Motor function is intact.     Coordination: Coordination is intact. Romberg sign negative.     Gait: Gait is intact. Gait normal.  Psychiatric:        Mood and Affect: Mood normal.        Speech: Speech normal.        Behavior: Behavior normal. Behavior is cooperative.      UC Treatments / Results  Labs (all labs ordered are listed, but only abnormal results are displayed) Labs Reviewed - No data to display  EKG   Radiology No results found.  Procedures Procedures (including critical care time)  Medications Ordered in UC Medications - No data to display  Initial Impression / Assessment and Plan / UC Course  I have reviewed the triage vital signs and the nursing notes.  Pertinent labs & imaging results that were available during my care of the patient were reviewed by me and considered in my medical decision making (see chart for details).   Patient is well-appearing, normotensive, afebrile, not tachycardic, not tachypneic, oxygenating well on room air.    1. Acute nonintractable headache, unspecified headache type No red flags in history or on exam today; vitals and exam are reassuring Patient declines IM medications to help with headache today in urgent care Treat with sumatriptan at  home Follow-up with PCP for persistent or worsening symptoms despite treatment  The patient was given the opportunity to ask questions.  All questions answered to their satisfaction.  The patient is in agreement to this plan.    Final Clinical Impressions(s) / UC Diagnoses   Final diagnoses:  Acute nonintractable headache, unspecified headache type     Discharge Instructions      Take the Sumatriptan for the headache.  Follow up with PCP if symptoms recur despite treatment for further evaluation and management of ongoing headaches.     ED Prescriptions     Medication Sig Dispense Auth. Provider   SUMAtriptan (IMITREX) 50 MG tablet Take 1 tablet (50 mg total) by mouth once for 1 dose. May repeat 1 dose 2 hours after first dose if headache persists or recurs.  Do not exceed more than 2 doses in 24 hour period. 4 tablet Valentino Nose, NP      PDMP not reviewed this encounter.   Valentino Nose, NP 04/08/23 1453

## 2023-04-08 NOTE — ED Triage Notes (Signed)
Pt reports she has been having headaches and lightheadedness x 2 weeks.

## 2023-05-28 ENCOUNTER — Ambulatory Visit
Admission: EM | Admit: 2023-05-28 | Discharge: 2023-05-28 | Disposition: A | Discharge status: Home / Self Care | Payer: 59 | Attending: Family Medicine | Admitting: Family Medicine

## 2023-05-28 ENCOUNTER — Ambulatory Visit: Payer: 59 | Admitting: Adult Health

## 2023-05-28 DIAGNOSIS — J4521 Mild intermittent asthma with (acute) exacerbation: Secondary | ICD-10-CM | POA: Diagnosis not present

## 2023-05-28 MED ORDER — PREDNISONE 20 MG PO TABS: 40.0000 mg | ORAL_TABLET | Freq: Every day | ORAL | 0 refills | Status: DC

## 2023-05-28 MED ORDER — ALBUTEROL SULFATE HFA 108 (90 BASE) MCG/ACT IN AERS: 1.0000 | INHALATION_SPRAY | Freq: Four times a day (QID) | RESPIRATORY_TRACT | 1 refills | Status: DC | PRN

## 2023-05-28 NOTE — ED Provider Notes (Signed)
[facility]   [id number] 05/28/23 Arrival Time: 0957  ASSESSMENT & PLAN:  1. Mild intermittent asthma with acute exacerbation    No resp distress here. Begin: Meds ordered this encounter  Medications   predniSONE (DELTASONE) 20 MG tablet    Sig: Take 2 tablets (40 mg total) by mouth daily.    Dispense:  10 tablet    Refill:  0   albuterol (VENTOLIN HFA) 108 (90 Base) MCG/ACT inhaler    Sig: Inhale 1-2 puffs into the lungs every 6 (six) hours as needed for wheezing or shortness of breath.    Dispense:  1 each    Refill:  1   Work note given. Asthma precautions given.  Recommend:  Follow-up Information     [facility].   Specialty: Urgent Care Why: If worsening or failing to improve as anticipated. Contact information: [address], [address] [place] [address] [phone]                Reviewed expectations re: course of current medical issues. Questions answered. Outlined signs and symptoms indicating need for more acute intervention. Patient verbalized understanding. After Visit Summary given.  SUBJECTIVE: History from: patient.  Diane Parks is a 32 y.o. female who presents with complaint of intermittent chest tightness and wheezing. Onset abrupt,  over past 1-2 days; related to new exertional job cleaning . Denies fever. Denies CP. Ambulatory without difficulty. No LE edema. Typically her asthma is well controlled.  Social History   Tobacco Use  Smoking Status Never  Smokeless Tobacco Never   OBJECTIVE:  Vitals:   05/28/23 1004  BP: 121/78  Pulse: 93  Resp: 13  Temp: 98.1 F (36.7 C)  TempSrc: Oral  SpO2: 97%    General appearance: alert; NAD HEENT: [place]; AT; without nasal congestion Neck: supple without LAD Cv: RRR without murmer Lungs: unlabored respirations, moderate bilateral expiratory wheezing; cough: absent; no significant respiratory distress Skin: warm and  dry Psychological: alert and cooperative; normal mood and affect  Imaging: No results found.  No Known Allergies  Past Medical History:  Diagnosis Date   Asthma    BV (bacterial vaginosis) 06/20/2015   Chlamydia    Hidradenitis    History of chlamydia 07/20/2015   Missed period 12/06/2015   Obesity    PCB (post coital bleeding) 12/06/2015   Trichimoniasis 03/08/2022   03/08/22 treated with flagyl   UTI (lower urinary tract infection) 06/20/2015   Vaginal discharge 06/20/2015   Family History  Problem Relation Age of Onset   Cancer Maternal Grandmother    Cancer Maternal Grandfather    Cancer Paternal Grandmother    Cancer Paternal Grandfather    Hypertension Mother    Heart disease Mother    Social History   Socioeconomic History   Marital status: Single    Spouse name: Not on file   Number of children: Not on file   Years of education: Not on file   Highest education level: Not on file  Occupational History   Not on file  Tobacco Use   Smoking status: Never   Smokeless tobacco: Never  Vaping Use   Vaping status: Never Used  Substance and Sexual Activity   Alcohol use: No   Drug use: No   Sexual activity: Yes    Birth control/protection: Condom, None  Other Topics Concern   Not on file  Social History Narrative   Not on file   Social Determinants of Health  Financial Resource Strain: Low Risk  (03/05/2022)   Overall Financial Resource Strain (CARDIA)    Difficulty of Paying Living Expenses: Not hard at all  Food Insecurity: No Food Insecurity (03/05/2022)   Hunger Vital Sign    Worried About Running Out of Food in the Last Year: Never true    Ran Out of Food in the Last Year: Never true  Transportation Needs: No Transportation Needs (03/05/2022)   PRAPARE - Administrator, Civil Service (Medical): No    Lack of Transportation (Non-Medical): No  Physical Activity: Insufficiently Active (03/05/2022)   Exercise Vital Sign    Days of Exercise per Week: 3  days    Minutes of Exercise per Session: 30 min  Stress: No Stress Concern Present (03/05/2022)   [employer] of Occupational Health - Occupational Stress Questionnaire    Feeling of Stress : Not at all  Social Connections: Unknown (03/05/2022)   Received from [facility]   Social Network    Social Network: Not on file  Recent Concern: Social Connections - Socially Isolated (03/05/2022)   Social Connection and Isolation Panel [NHANES]    Frequency of Communication with Friends and Family: More than three times a week    Frequency of Social Gatherings with Friends and Family: More than three times a week    Attends Religious Services: Never    Database administrator or Organizations: No    Attends Banker Meetings: Never    Marital Status: Never married  Intimate Partner Violence: Not At Risk (03/05/2022)   Humiliation, Afraid, Rape, and Kick questionnaire    Fear of Current or Ex-Partner: No    Emotionally Abused: No    Physically Abused: No    Sexually Abused: No             Mardella Layman, MD 05/28/23 [phone]

## 2023-05-28 NOTE — ED Triage Notes (Signed)
Pt c/o SOB, pt states she has asthma, she was at work when the feeling came on she was walking up 6 flights of stairs when she lost her breath and felt as if she could not catch it.

## 2023-07-17 ENCOUNTER — Ambulatory Visit: Payer: 59 | Admitting: Adult Health

## 2023-07-30 ENCOUNTER — Other Ambulatory Visit: Payer: 59 | Admitting: Women's Health

## 2023-07-31 ENCOUNTER — Other Ambulatory Visit: Payer: 59 | Admitting: Women's Health

## 2023-08-05 DIAGNOSIS — Z419 Encounter for procedure for purposes other than remedying health state, unspecified: Secondary | ICD-10-CM | POA: Diagnosis not present

## 2023-08-25 ENCOUNTER — Ambulatory Visit (INDEPENDENT_AMBULATORY_CARE_PROVIDER_SITE_OTHER): Payer: 59 | Admitting: Family Medicine

## 2023-08-25 VITALS — BP 116/77 | HR 83 | Resp 16 | Ht 61.0 in | Wt 218.4 lb

## 2023-08-25 DIAGNOSIS — E559 Vitamin D deficiency, unspecified: Secondary | ICD-10-CM

## 2023-08-25 DIAGNOSIS — Z8709 Personal history of other diseases of the respiratory system: Secondary | ICD-10-CM | POA: Diagnosis not present

## 2023-08-25 DIAGNOSIS — G43009 Migraine without aura, not intractable, without status migrainosus: Secondary | ICD-10-CM

## 2023-08-25 DIAGNOSIS — D509 Iron deficiency anemia, unspecified: Secondary | ICD-10-CM | POA: Diagnosis not present

## 2023-08-25 DIAGNOSIS — E538 Deficiency of other specified B group vitamins: Secondary | ICD-10-CM

## 2023-08-25 DIAGNOSIS — E038 Other specified hypothyroidism: Secondary | ICD-10-CM | POA: Diagnosis not present

## 2023-08-25 DIAGNOSIS — Z136 Encounter for screening for cardiovascular disorders: Secondary | ICD-10-CM

## 2023-08-25 DIAGNOSIS — R7301 Impaired fasting glucose: Secondary | ICD-10-CM | POA: Diagnosis not present

## 2023-08-25 DIAGNOSIS — G43909 Migraine, unspecified, not intractable, without status migrainosus: Secondary | ICD-10-CM | POA: Insufficient documentation

## 2023-08-25 MED ORDER — ALBUTEROL SULFATE HFA 108 (90 BASE) MCG/ACT IN AERS: 2.0000 | INHALATION_SPRAY | Freq: Four times a day (QID) | RESPIRATORY_TRACT | 2 refills | Status: DC | PRN

## 2023-08-25 MED ORDER — SUMATRIPTAN SUCCINATE 25 MG PO TABS
25.0000 mg | ORAL_TABLET | Freq: Two times a day (BID) | ORAL | 0 refills | Status: DC | PRN
Start: 2023-08-25 — End: 2023-09-23

## 2023-08-25 NOTE — Patient Instructions (Signed)

## 2023-08-25 NOTE — Assessment & Plan Note (Signed)
Trial on Imitrex 25 mg PRN Advise methods include deep breathing, cognitive behavioral therapy focusing on changing thoughts, Limit or avoid alcohol, caffeine, chocolate, canner foods, MSG and aspartame.  Implement sleep hygiene includes not watching TV or listen music in bed, don't eat heavy meals within a couple of hours of bedtime, don't use your phone, laptop, or tablet at bedtime

## 2023-08-25 NOTE — Progress Notes (Signed)
New Patient Office Visit   Subjective   Patient ID: Diane Parks, female    DOB: 1990-11-09  Age: 32 y.o. MRN: [id number]  CC:  Chief Complaint  Patient presents with   Establish Care    Wants labs and especially her thyroid checked because thyroid disease/cancer runs in her family     HPI Prudencia Petrella 32 year old female, presents to establish care. She  has a past medical history of Asthma, BV (bacterial vaginosis) (06/20/2015), Chlamydia, Hidradenitis, History of chlamydia (07/20/2015), Missed period (12/06/2015), Obesity, PCB (post coital bleeding) (12/06/2015), Trichimoniasis (03/08/2022), UTI (lower urinary tract infection) (06/20/2015), and Vaginal discharge (06/20/2015).  Migraine The patient has a chronic history of migraines, with the current episode persisting for over a year. The migraines occur intermittently and have been gradually worsening over time. The pain is localized bilaterally in the frontal, temporal, and occipital regions, without radiation. The patient describes the pain as squeezing, throbbing, and aching, with a severity of 7/10. Associated symptoms include dizziness, loss of balance, and photophobia. Pertinent negatives include the absence of abdominal pain, fever, scalp tenderness, or visual changes. The symptoms are aggravated by bright light. The patient has tried NSAIDs and acetaminophen, but neither has provided relief.      Outpatient Encounter Medications as of 08/25/2023  Medication Sig   SUMAtriptan (IMITREX) 25 MG tablet Take 1 tablet (25 mg total) by mouth every 12 (twelve) hours as needed for migraine. May repeat in 2 hours if headache persists or recurs.   [DISCONTINUED] albuterol (VENTOLIN HFA) 108 (90 Base) MCG/ACT inhaler Inhale 2 puffs into the lungs every 6 (six) hours as needed for wheezing or shortness of breath.   [DISCONTINUED] albuterol (VENTOLIN HFA) 108 (90 Base) MCG/ACT inhaler Inhale 1-2 puffs into the lungs every 6 (six) hours as needed  for wheezing or shortness of breath.   [DISCONTINUED] predniSONE (DELTASONE) 20 MG tablet Take 2 tablets (40 mg total) by mouth daily.   No facility-administered encounter medications on file as of 08/25/2023.    Past Surgical History:  Procedure Laterality Date   EYE SURGERY     EYE SURGERY      Review of Systems  Constitutional:  Negative for chills.  Eyes:  Positive for photophobia.  Respiratory:  Negative for shortness of breath.   Cardiovascular:  Negative for chest pain.  Genitourinary:  Negative for dysuria.  Musculoskeletal:  Negative for myalgias.  Skin:  Negative for rash.  Neurological:  Positive for dizziness and headaches.  Psychiatric/Behavioral:  Negative for depression. The patient is not nervous/anxious.       Objective    BP 116/77   Pulse 83   Resp 16   Ht 5\' 1"  (1.549 m)   Wt 218 lb 6.4 oz (99.1 kg)   SpO2 96%   BMI 41.27 kg/m   Physical Exam Vitals reviewed.  Constitutional:      General: She is not in acute distress.    Appearance: Normal appearance. She is not ill-appearing, toxic-appearing or diaphoretic.  HENT:     Head: Normocephalic.     Right Ear: Tympanic membrane normal.     Left Ear: Tympanic membrane normal.     Nose: Nose normal.     Mouth/Throat:     Mouth: Mucous membranes are moist.  Eyes:     General:        Right eye: No discharge.        Left eye: No discharge.     Conjunctiva/sclera: Conjunctivae normal.  Pupils: Pupils are equal, round, and reactive to light.  Cardiovascular:     Rate and Rhythm: Normal rate.     Pulses: Normal pulses.     Heart sounds: Normal heart sounds.  Pulmonary:     Effort: Pulmonary effort is normal. No respiratory distress.     Breath sounds: Normal breath sounds.  Abdominal:     General: Bowel sounds are normal.     Palpations: Abdomen is soft.     Tenderness: There is no abdominal tenderness. There is no right CVA tenderness, left CVA tenderness or guarding.  Musculoskeletal:         General: Normal range of motion.     Cervical back: Normal range of motion.  Skin:    General: Skin is warm and dry.     Capillary Refill: Capillary refill takes less than 2 seconds.  Neurological:     Mental Status: She is alert.     Coordination: Coordination normal.     Gait: Gait normal.  Psychiatric:        Mood and Affect: Mood normal.       Assessment & Plan:  TSH (thyroid-stimulating hormone deficiency) -     TSH + free T4 -     Thyroid peroxidase antibody  Vitamin D deficiency -     VITAMIN D 25 Hydroxy (Vit-D Deficiency, Fractures)  Vitamin B12 deficiency -     Vitamin B12  Encounter for screening for cardiovascular disorders -     Lipid panel -     CMP14+EGFR -     CBC with Differential/Platelet  IFG (impaired fasting glucose) -     Hemoglobin A1c  History of asthma  Iron deficiency anemia, unspecified iron deficiency anemia type -     Iron, TIBC and Ferritin Panel  Migraine without aura and without status migrainosus, not intractable Assessment & Plan: Trial on Imitrex 25 mg PRN Advise methods include deep breathing, cognitive behavioral therapy focusing on changing thoughts, Limit or avoid alcohol, caffeine, chocolate, canner foods, MSG and aspartame.  Implement sleep hygiene includes not watching TV or listen music in bed, don't eat heavy meals within a couple of hours of bedtime, don't use your phone, laptop, or tablet at bedtime    Orders: -     SUMAtriptan Succinate; Take 1 tablet (25 mg total) by mouth every 12 (twelve) hours as needed for migraine. May repeat in 2 hours if headache persists or recurs.  Dispense: 10 tablet; Refill: 0    Return in about 6 weeks (around 10/06/2023), or if symptoms worsen or fail to improve.   Cruzita Lederer Newman Nip, FNP

## 2023-08-26 ENCOUNTER — Other Ambulatory Visit: Payer: Self-pay | Admitting: Family Medicine

## 2023-08-26 LAB — TSH+FREE T4
Free T4: 0.9 ng/dL (ref 0.82–1.77)
TSH: 14.8 u[IU]/mL — ABNORMAL HIGH (ref 0.450–4.500)

## 2023-08-26 LAB — HEMOGLOBIN A1C
Est. average glucose Bld gHb Est-mCnc: 126 mg/dL
Hgb A1c MFr Bld: 6 % — ABNORMAL HIGH (ref 4.8–5.6)

## 2023-08-26 LAB — CBC WITH DIFFERENTIAL/PLATELET
Basophils Absolute: 0.1 10*3/uL (ref 0.0–0.2)
Basos: 1 %
EOS (ABSOLUTE): 0.2 10*3/uL (ref 0.0–0.4)
Eos: 2 %
Hematocrit: 43.5 % (ref 34.0–46.6)
Hemoglobin: 13.8 g/dL (ref 11.1–15.9)
Immature Grans (Abs): 0 10*3/uL (ref 0.0–0.1)
Immature Granulocytes: 0 %
Lymphocytes Absolute: 3.2 10*3/uL — ABNORMAL HIGH (ref 0.7–3.1)
Lymphs: 39 %
MCH: 27.1 pg (ref 26.6–33.0)
MCHC: 31.7 g/dL (ref 31.5–35.7)
MCV: 86 fL (ref 79–97)
Monocytes Absolute: 0.6 10*3/uL (ref 0.1–0.9)
Monocytes: 8 %
Neutrophils Absolute: 4.1 10*3/uL (ref 1.4–7.0)
Neutrophils: 50 %
Platelets: 351 10*3/uL (ref 150–450)
RBC: 5.09 x10E6/uL (ref 3.77–5.28)
RDW: 12.7 % (ref 11.7–15.4)
WBC: 8.2 10*3/uL (ref 3.4–10.8)

## 2023-08-26 LAB — LIPID PANEL
Chol/HDL Ratio: 4.3 ratio (ref 0.0–4.4)
Cholesterol, Total: 180 mg/dL (ref 100–199)
HDL: 42 mg/dL (ref >39)
LDL Chol Calc (NIH): 103 mg/dL — ABNORMAL HIGH (ref 0–99)
Triglycerides: 200 mg/dL — ABNORMAL HIGH (ref 0–149)
VLDL Cholesterol Cal: 35 mg/dL (ref 5–40)

## 2023-08-26 LAB — CMP14+EGFR
ALT: 19 [IU]/L (ref 0–32)
AST: 17 [IU]/L (ref 0–40)
Albumin: 4 g/dL (ref 4.0–5.0)
Alkaline Phosphatase: 64 [IU]/L (ref 44–121)
BUN/Creatinine Ratio: 13 (ref 9–23)
BUN: 9 mg/dL (ref 6–20)
Bilirubin Total: 0.3 mg/dL (ref 0.0–1.2)
CO2: 24 mmol/L (ref 20–29)
Calcium: 9.2 mg/dL (ref 8.7–10.2)
Chloride: 100 mmol/L (ref 96–106)
Creatinine, Ser: 0.71 mg/dL (ref 0.57–1.00)
Globulin, Total: 3.5 g/dL (ref 1.5–4.5)
Glucose: 90 mg/dL (ref 70–99)
Potassium: 4.3 mmol/L (ref 3.5–5.2)
Sodium: 137 mmol/L (ref 134–144)
Total Protein: 7.5 g/dL (ref 6.0–8.5)
eGFR: 117 mL/min/{1.73_m2} (ref >59)

## 2023-08-26 LAB — IRON,TIBC AND FERRITIN PANEL
Ferritin: 68 ng/mL (ref 15–150)
Iron Saturation: 16 % (ref 15–55)
Iron: 57 ug/dL (ref 27–159)
Total Iron Binding Capacity: 366 ug/dL (ref 250–450)
UIBC: 309 ug/dL (ref 131–425)

## 2023-08-26 LAB — THYROID PEROXIDASE ANTIBODY: Thyroperoxidase Ab SerPl-aCnc: 307 [IU]/mL — ABNORMAL HIGH (ref 0–34)

## 2023-08-26 LAB — VITAMIN D 25 HYDROXY (VIT D DEFICIENCY, FRACTURES): Vit D, 25-Hydroxy: 22.8 ng/mL — ABNORMAL LOW (ref 30.0–100.0)

## 2023-08-26 LAB — VITAMIN B12: Vitamin B-12: 484 pg/mL (ref 232–1245)

## 2023-08-26 MED ORDER — LEVOTHYROXINE SODIUM 50 MCG PO TABS: 50.0000 ug | ORAL_TABLET | Freq: Every day | ORAL | 3 refills | Status: DC

## 2023-08-27 ENCOUNTER — Ambulatory Visit: Payer: 59 | Admitting: Internal Medicine

## 2023-09-05 DIAGNOSIS — Z419 Encounter for procedure for purposes other than remedying health state, unspecified: Secondary | ICD-10-CM | POA: Diagnosis not present

## 2023-09-10 DIAGNOSIS — Z01419 Encounter for gynecological examination (general) (routine) without abnormal findings: Secondary | ICD-10-CM | POA: Diagnosis not present

## 2023-09-10 DIAGNOSIS — Z113 Encounter for screening for infections with a predominantly sexual mode of transmission: Secondary | ICD-10-CM | POA: Diagnosis not present

## 2023-09-10 DIAGNOSIS — Z0000 Encounter for general adult medical examination without abnormal findings: Secondary | ICD-10-CM | POA: Diagnosis not present

## 2023-09-10 DIAGNOSIS — G43E09 Chronic migraine with aura, not intractable, without status migrainosus: Secondary | ICD-10-CM | POA: Diagnosis not present

## 2023-09-10 DIAGNOSIS — N912 Amenorrhea, unspecified: Secondary | ICD-10-CM | POA: Diagnosis not present

## 2023-09-22 ENCOUNTER — Encounter: Payer: Self-pay | Admitting: Family Medicine

## 2023-09-23 ENCOUNTER — Other Ambulatory Visit: Payer: Self-pay | Admitting: Family Medicine

## 2023-09-23 MED ORDER — SUMATRIPTAN SUCCINATE 50 MG PO TABS: 50.0000 mg | ORAL_TABLET | Freq: Two times a day (BID) | ORAL | 0 refills | Status: AC | PRN

## 2023-09-23 NOTE — Telephone Encounter (Signed)
Please inform patient:  I have increased the Imitrex dosage to 50 mg to be taken every 12 hours. Please follow up if this adjusted dosage does not provide relief

## 2023-09-26 ENCOUNTER — Ambulatory Visit (INDEPENDENT_AMBULATORY_CARE_PROVIDER_SITE_OTHER): Payer: 59 | Admitting: Family Medicine

## 2023-09-26 VITALS — BP 130/81 | HR 103 | Temp 97.1°F | Ht 61.0 in | Wt 221.0 lb

## 2023-09-26 DIAGNOSIS — B349 Viral infection, unspecified: Secondary | ICD-10-CM | POA: Insufficient documentation

## 2023-09-26 DIAGNOSIS — R0989 Other specified symptoms and signs involving the circulatory and respiratory systems: Secondary | ICD-10-CM

## 2023-09-26 DIAGNOSIS — G43709 Chronic migraine without aura, not intractable, without status migrainosus: Secondary | ICD-10-CM

## 2023-09-26 MED ORDER — PREDNISONE 20 MG PO TABS: 20.0000 mg | ORAL_TABLET | Freq: Two times a day (BID) | ORAL | 0 refills | Status: AC

## 2023-09-26 MED ORDER — BENZONATATE 200 MG PO CAPS: 200.0000 mg | ORAL_CAPSULE | Freq: Two times a day (BID) | ORAL | 0 refills | Status: DC | PRN

## 2023-09-26 MED ORDER — NOREL AD 4-10-325 MG PO TABS: ORAL_TABLET | ORAL | 0 refills | Status: AC

## 2023-09-26 MED ORDER — AZELASTINE-FLUTICASONE 137-50 MCG/ACT NA SUSP: 1.0000 | Freq: Two times a day (BID) | NASAL | 1 refills | Status: AC

## 2023-09-26 MED ORDER — TOPIRAMATE 25 MG PO TABS
25.0000 mg | ORAL_TABLET | Freq: Every day | ORAL | 3 refills | Status: AC
Start: 2023-09-26 — End: ?

## 2023-09-26 NOTE — Progress Notes (Signed)
Established Patient Office Visit   Subjective  Patient ID: Diane Parks, female    DOB: [birth year]  Age: 32 y.o. MRN: [id number]  Chief Complaint  Patient presents with   Acute Visit    Wants to be tested for covid.  Symptoms of SOB, headache, cold chills.  Dizzy Since Monday .    She  has a past medical history of Asthma, BV (bacterial vaginosis) (06/20/2015), Chlamydia, Hidradenitis, History of chlamydia (07/20/2015), Missed period (12/06/2015), Obesity, PCB (post coital bleeding) (12/06/2015), Trichimoniasis (03/08/2022), UTI (lower urinary tract infection) (06/20/2015), and Vaginal discharge (06/20/2015).  Patient complains of evaluation of cough. Patient describes symptoms of shortness of breath on exertion, cough, fatigue, headache, malaise, myalgias, and sore throat. Symptoms began 4 days ago and are gradually worsening since that time. Patient denies chest pain or nausea and vomiting. Treatment thus far includes OTC analgesics/antipyretics: not very effective and anti-tussive: not very effective Past pulmonary history is significant for no history of pneumonia or bronchitis     Review of Systems  Constitutional:  Negative for chills and fever.  HENT:  Positive for congestion and sore throat.   Respiratory:  Positive for cough and shortness of breath.   Cardiovascular:  Negative for chest pain.  Neurological:  Positive for dizziness and headaches.      Objective:     BP 130/81   Pulse (!) 103   Temp (!) 97.1 F (36.2 C)   Ht 5\' 1"  (1.549 m)   Wt 221 lb (100.2 kg)   SpO2 95%   BMI 41.76 kg/m  BP Readings from Last 3 Encounters:  09/26/23 130/81  08/25/23 116/77  05/28/23 121/78      Physical Exam Vitals reviewed.  Constitutional:      General: She is not in acute distress.    Appearance: Normal appearance. She is not ill-appearing, toxic-appearing or diaphoretic.  HENT:     Head: Normocephalic.     Nose: Congestion and rhinorrhea present.     Mouth/Throat:      Pharynx: Posterior oropharyngeal erythema present.  Eyes:     General:        Right eye: No discharge.        Left eye: No discharge.     Conjunctiva/sclera: Conjunctivae normal.  Cardiovascular:     Rate and Rhythm: Normal rate.     Pulses: Normal pulses.     Heart sounds: Normal heart sounds.  Pulmonary:     Effort: Pulmonary effort is normal. No respiratory distress.     Breath sounds: Normal breath sounds.  Musculoskeletal:        General: Normal range of motion.     Cervical back: Normal range of motion.  Skin:    General: Skin is warm and dry.     Capillary Refill: Capillary refill takes less than 2 seconds.  Neurological:     Mental Status: She is alert.     Coordination: Coordination normal.     Gait: Gait normal.  Psychiatric:        Mood and Affect: Mood normal.        Behavior: Behavior normal.      No results found for any visits on 09/26/23.  The ASCVD Risk score (Arnett DK, et al., 2019) failed to calculate for the following reasons:   The 2019 ASCVD risk score is only valid for ages 75 to 61    Assessment & Plan:  Viral illness Assessment & Plan: COVID-19, Flu A+B and RSV ordered Prednisone  20 mg twice day x 5 days , Benzonatate 200 mg PRN, Azelastine-fluticasone nasal spray PRN,   Advise patient to rest to support your body's recovery. Stay hydrated by drinking water, tea, or broth. Using a humidifier can help soothe throat irritation and ease nasal congestion. For fever or pain, acetaminophen (Tylenol) is recommended. To relieve other symptoms, try saline nasal sprays, throat lozenges, or gargling with saltwater. Focus on eating light, healthy meals like fruits and vegetables to keep your strength up. Practice good hygiene by washing your hands frequently and covering your mouth when coughing or sneezing.Follow-up for worsening or persistent symptoms. Patient verbalizes understanding regarding plan of care and all questions answered    Orders: -      predniSONE; Take 1 tablet (20 mg total) by mouth 2 (two) times daily with a meal for 5 days.  Dispense: 10 tablet; Refill: 0 -     Norel AD; Take 1 tablet every 4 hours while symptoms persists. Do not take more than 6 tablets in 24 hours.  Dispense: 20 tablet; Refill: 0 -     Benzonatate; Take 1 capsule (200 mg total) by mouth 2 (two) times daily as needed for cough.  Dispense: 20 capsule; Refill: 0 -     Azelastine-Fluticasone; Place 1 spray into the nose every 12 (twelve) hours.  Dispense: 23 g; Refill: 1  Respiratory symptoms -     COVID-19, Flu A+B and RSV  Chronic migraine without aura without status migrainosus, not intractable Assessment & Plan: Patient experiences migraines every other day and reports frequent use of Imitrex for symptom relief. A trial of Topamax (topiramate) 25 mg daily has been initiated to help reduce the frequency and severity of migraines.  Advise on Migraine Management Tips:  Maintain a consistent routine, including regular sleep, meals, and hydration, to minimize migraine triggers. Identify and avoid specific triggers such as stress, certain foods, or environmental factors. Consider incorporating stress-relief techniques like yoga, meditation, or deep breathing exercises into daily life   Other orders -     Topiramate; Take 1 tablet (25 mg total) by mouth daily.  Dispense: 30 tablet; Refill: 3    Return in 6 months (on 03/25/2024), or if symptoms worsen or fail to improve, for thyroid, hyperlipidemia, pre-diabetes.   Cruzita Lederer Newman Nip, FNP

## 2023-09-26 NOTE — Assessment & Plan Note (Signed)
COVID-19, Flu A+B and RSV ordered Prednisone 20 mg twice day x 5 days , Benzonatate 200 mg PRN, Azelastine-fluticasone nasal spray PRN,     Advise patient to rest to support your body's recovery. Stay hydrated by drinking water, tea, or broth. Using a humidifier can help soothe throat irritation and ease nasal congestion. For fever or pain, acetaminophen (Tylenol) is recommended. To relieve other symptoms, try saline nasal sprays, throat lozenges, or gargling with saltwater. Focus on eating light, healthy meals like fruits and vegetables to keep your strength up. Practice good hygiene by washing your hands frequently and covering your mouth when coughing or sneezing.Follow-up for worsening or persistent symptoms. Patient verbalizes understanding regarding plan of care and all questions answered

## 2023-09-26 NOTE — Assessment & Plan Note (Signed)
Patient experiences migraines every other day and reports frequent use of Imitrex for symptom relief. A trial of Topamax (topiramate) 25 mg daily has been initiated to help reduce the frequency and severity of migraines.  Advise on Migraine Management Tips:  Maintain a consistent routine, including regular sleep, meals, and hydration, to minimize migraine triggers. Identify and avoid specific triggers such as stress, certain foods, or environmental factors. Consider incorporating stress-relief techniques like yoga, meditation, or deep breathing exercises into daily life

## 2023-09-28 LAB — COVID-19, FLU A+B AND RSV
Influenza A, NAA: NOT DETECTED
Influenza B, NAA: NOT DETECTED
RSV, NAA: NOT DETECTED
SARS-CoV-2, NAA: NOT DETECTED

## 2023-10-05 DIAGNOSIS — Z419 Encounter for procedure for purposes other than remedying health state, unspecified: Secondary | ICD-10-CM | POA: Diagnosis not present

## 2023-11-03 ENCOUNTER — Telehealth: Payer: 59 | Admitting: Family Medicine

## 2023-11-03 NOTE — Progress Notes (Signed)
Pt did not show for visit. DWB 

## 2023-11-05 DIAGNOSIS — Z419 Encounter for procedure for purposes other than remedying health state, unspecified: Secondary | ICD-10-CM | POA: Diagnosis not present

## 2023-11-07 ENCOUNTER — Other Ambulatory Visit: Payer: Self-pay | Admitting: Family Medicine

## 2023-11-07 ENCOUNTER — Encounter: Payer: Self-pay | Admitting: Family Medicine

## 2023-11-07 NOTE — Telephone Encounter (Signed)
 Patient needs and office visit with GYN, patient is established  at [facility]

## 2023-11-13 DIAGNOSIS — E039 Hypothyroidism, unspecified: Secondary | ICD-10-CM | POA: Diagnosis not present

## 2023-11-13 DIAGNOSIS — N912 Amenorrhea, unspecified: Secondary | ICD-10-CM | POA: Diagnosis not present

## 2023-11-28 ENCOUNTER — Telehealth: Payer: Medicaid Other | Admitting: Family Medicine

## 2023-11-28 DIAGNOSIS — N390 Urinary tract infection, site not specified: Secondary | ICD-10-CM

## 2023-11-28 MED ORDER — CEPHALEXIN 500 MG PO CAPS: 500.0000 mg | ORAL_CAPSULE | Freq: Two times a day (BID) | ORAL | 0 refills | Status: AC

## 2023-11-28 NOTE — Progress Notes (Signed)

## 2023-12-06 DIAGNOSIS — Z419 Encounter for procedure for purposes other than remedying health state, unspecified: Secondary | ICD-10-CM | POA: Diagnosis not present

## 2023-12-16 ENCOUNTER — Encounter: Payer: Self-pay | Admitting: Emergency Medicine

## 2023-12-16 ENCOUNTER — Ambulatory Visit
Admission: EM | Admit: 2023-12-16 | Discharge: 2023-12-16 | Disposition: A | Discharge status: Home / Self Care | Payer: Medicaid Other | Attending: Nurse Practitioner | Admitting: Nurse Practitioner

## 2023-12-16 DIAGNOSIS — R6889 Other general symptoms and signs: Secondary | ICD-10-CM | POA: Diagnosis not present

## 2023-12-16 DIAGNOSIS — B349 Viral infection, unspecified: Secondary | ICD-10-CM | POA: Diagnosis not present

## 2023-12-16 LAB — POC COVID19/FLU A&B COMBO
Covid Antigen, POC: NEGATIVE
Influenza A Antigen, POC: NEGATIVE
Influenza B Antigen, POC: NEGATIVE

## 2023-12-16 MED ORDER — ONDANSETRON 4 MG PO TBDP: 4.0000 mg | ORAL_TABLET | Freq: Three times a day (TID) | ORAL | 0 refills | Status: AC | PRN

## 2023-12-16 MED ORDER — ONDANSETRON 4 MG PO TBDP
4.0000 mg | ORAL_TABLET | Freq: Once | ORAL | Status: AC
  Administered 2023-12-16: 4 mg via ORAL

## 2023-12-16 NOTE — ED Provider Notes (Signed)
[facility]    CSN: [id number] Arrival date & time: 12/16/23  1813      History   Chief Complaint No chief complaint on file.   HPI Diane Parks is a 33 y.o. female.   The history is provided by the patient.   Patient presents for sudden onset of fever, chills, body aches, headache, nausea, and vomiting.  Denies ear pain, ear drainage, cough, abdominal pain, diarrhea, or constipation.  Patient states that she took Robitussin prior to arrival.  Denies any obvious known sick contacts.  Past Medical History:  Diagnosis Date   Asthma    BV (bacterial vaginosis) 06/20/2015   Chlamydia    Hidradenitis    History of chlamydia 07/20/2015   Missed period 12/06/2015   Obesity    PCB (post coital bleeding) 12/06/2015   Trichimoniasis 03/08/2022   03/08/22 treated with flagyl   UTI (lower urinary tract infection) 06/20/2015   Vaginal discharge 06/20/2015    Patient Active Problem List   Diagnosis Date Noted   Viral illness 09/26/2023   Migraines 08/25/2023   Trichomoniasis 03/08/2022   Screening examination for STD (sexually transmitted disease) 03/05/2022   Family planning 03/05/2022   Patient desires pregnancy 03/05/2022   Encounter for well woman exam with routine gynecological exam 03/05/2022   Pregnancy examination or test, negative result 12/17/2019   Encounter for initial prescription of contraceptive pills 12/17/2019   Encounter for menstrual regulation 12/17/2019   Irregular periods 11/30/2019   Missed periods 11/30/2019   Encounter for initial prescription of injectable contraceptive 06/05/2017   Encounter for gynecological examination with Papanicolaou smear of cervix 06/05/2017   PCB (post coital bleeding) 12/06/2015   Missed period 12/06/2015   History of chlamydia 07/20/2015   Vaginal discharge 06/20/2015   BV (bacterial vaginosis) 06/20/2015   Lower urinary tract infectious disease 06/20/2015   Hidradenitis 03/15/2015   Obesity 03/07/2014   Absence  of menstruation 02/18/2013    Past Surgical History:  Procedure Laterality Date   EYE SURGERY     EYE SURGERY      OB History     Gravida  1   Para      Term      Preterm      AB      Living         SAB      IAB      Ectopic      Multiple      Live Births               Home Medications    Prior to Admission medications   Medication Sig Start Date End Date Taking? Authorizing Provider  ondansetron (ZOFRAN-ODT) 4 MG disintegrating tablet Take 1 tablet (4 mg total) by mouth every 8 (eight) hours as needed. 12/16/23  Yes Leath-Warren, Sadie Haber, NP  Azelastine-Fluticasone 137-50 MCG/ACT SUSP Place 1 spray into the nose every 12 (twelve) hours. 09/26/23   Del Nigel Berthold, FNP  Chlorphen-PE-Acetaminophen (NOREL AD) 4-10-325 MG TABS Take 1 tablet every 4 hours while symptoms persists. Do not take more than 6 tablets in 24 hours. 09/26/23   Del Nigel Berthold, FNP  levothyroxine (SYNTHROID) 50 MCG tablet Take 1 tablet (50 mcg total) by mouth daily. 08/26/23   Del Nigel Berthold, FNP  SUMAtriptan (IMITREX) 50 MG tablet Take 1 tablet (50 mg total) by mouth every 12 (twelve) hours as needed for migraine. May repeat in 2 hours if headache persists or  recurs. 09/23/23   Del Nigel Berthold, FNP  topiramate (TOPAMAX) 25 MG tablet Take 1 tablet (25 mg total) by mouth daily. 09/26/23   Del Nigel Berthold, FNP    Family History Family History  Problem Relation Age of Onset   Hypertension Mother    Heart disease Mother    Thyroid disease Sister    Cancer Maternal Grandmother    Cancer Maternal Grandfather    Cancer Paternal Grandmother    Cancer Paternal Grandfather     Social History Social History   Tobacco Use   Smoking status: Never   Smokeless tobacco: Never  Vaping Use   Vaping status: Never Used  Substance Use Topics   Alcohol use: No   Drug use: No     Allergies   Patient has no known allergies.   Review of  Systems Review of Systems Per HPI  Physical Exam Triage Vital Signs ED Triage Vitals  Encounter Vitals Group     BP 12/16/23 1820 101/68     Systolic BP Percentile --      Diastolic BP Percentile --      Pulse Rate 12/16/23 1820 (!) 110     Resp 12/16/23 1820 20     Temp 12/16/23 1820 (!) 100.4 F (38 C)     Temp Source 12/16/23 1820 Oral     SpO2 12/16/23 1820 94 %     Weight --      Height --      Head Circumference --      Peak Flow --      Pain Score 12/16/23 1823 7     Pain Loc --      Pain Education --      Exclude from Growth Chart --    No data found.  Updated Vital Signs BP 101/68 (BP Location: Right Arm)   Pulse (!) 110   Temp (!) 100.4 F (38 C) (Oral)   Resp 20   SpO2 94%   Visual Acuity Right Eye Distance:   Left Eye Distance:   Bilateral Distance:    Right Eye Near:   Left Eye Near:    Bilateral Near:     Physical Exam Vitals and nursing note reviewed.  Constitutional:      General: She is not in acute distress.    Appearance: Normal appearance.  HENT:     Head: Normocephalic.     Right Ear: Tympanic membrane, ear canal and external ear normal.     Left Ear: Tympanic membrane, ear canal and external ear normal.     Nose: Nose normal.     Right Turbinates: Enlarged and swollen.     Left Turbinates: Enlarged and swollen.     Right Sinus: No maxillary sinus tenderness or frontal sinus tenderness.     Left Sinus: No maxillary sinus tenderness or frontal sinus tenderness.     Mouth/Throat:     Lips: Pink.     Mouth: Mucous membranes are moist.     Pharynx: Oropharynx is clear.  Eyes:     Extraocular Movements: Extraocular movements intact.     Conjunctiva/sclera: Conjunctivae normal.     Pupils: Pupils are equal, round, and reactive to light.  Cardiovascular:     Rate and Rhythm: Regular rhythm. Tachycardia present.     Pulses: Normal pulses.     Heart sounds: Normal heart sounds.  Pulmonary:     Effort: Pulmonary effort is normal. No  respiratory distress.  Breath sounds: Normal breath sounds. No stridor. No wheezing, rhonchi or rales.  Abdominal:     General: Bowel sounds are normal.     Palpations: Abdomen is soft.     Tenderness: There is no abdominal tenderness.     Comments: Patient actively vomiting.  Zofran 4 mg ODT administered.  Musculoskeletal:     Cervical back: Normal range of motion.  Lymphadenopathy:     Cervical: No cervical adenopathy.  Skin:    General: Skin is warm and dry.  Neurological:     General: No focal deficit present.     Mental Status: She is alert and oriented to person, place, and time.  Psychiatric:        Mood and Affect: Mood normal.        Behavior: Behavior normal.      UC Treatments / Results  Labs (all labs ordered are listed, but only abnormal results are displayed) Labs Reviewed  POC COVID19/FLU A&B COMBO - Normal    EKG   Radiology No results found.  Procedures Procedures (including critical care time)  Medications Ordered in UC Medications  ondansetron (ZOFRAN-ODT) disintegrating tablet 4 mg (4 mg Oral Given 12/16/23 1833)    Initial Impression / Assessment and Plan / UC Course  I have reviewed the triage vital signs and the nursing notes.  Pertinent labs & imaging results that were available during my care of the patient were reviewed by me and considered in my medical decision making (see chart for details).  COVID/flu test was negative.  Symptoms are consistent with a viral illness. Zofran 4 mg ODT prescribed for nausea.  Supportive care recommendations were provided and discussed with the patient to include fluids, rest, over-the-counter analgesics, use of Pedialyte or Gatorade for risk of dehydration.  Discussed indications regarding follow-up.  Patient was in agreement with this plan of care and verbalized understanding.  All questions were answered.  Patient stable for discharge.  Work note was provided.  Final Clinical Impressions(s) / UC  Diagnoses   Final diagnoses:  Flu-like symptoms  Viral illness     Discharge Instructions      The COVID/flu test was negative. Take medication as prescribed. Recommend over-the-counter Tylenol or ibuprofen as needed for pain, fever, or general discomfort. Increase fluids.  Recommend the use of Pedialyte or Gatorade to prevent dehydration.  Recommend a brat diet to include bananas, rice, applesauce, and toast while nausea and vomiting persist. If you develop a cough, recommend using a humidifier in your bedroom at nighttime during sleep and sleeping elevated on pillows while cough symptoms persist. You should stay home until you have been fever free for 24 hours with no medication. Symptoms should improve over the next 5 to 7 days.  However, if not, or appear to be worsening, you may follow-up in this clinic or with your primary care physician for further evaluation. Follow-up as needed.     ED Prescriptions     Medication Sig Dispense Auth. Provider   ondansetron (ZOFRAN-ODT) 4 MG disintegrating tablet Take 1 tablet (4 mg total) by mouth every 8 (eight) hours as needed. 20 tablet Leath-Warren, Sadie Haber, NP      PDMP not reviewed this encounter.   Abran Cantor, NP 12/16/23 [phone]

## 2023-12-16 NOTE — ED Triage Notes (Signed)
Fever, chills, body aches, headache that started today.

## 2023-12-16 NOTE — Discharge Instructions (Addendum)
The COVID/flu test was negative. Take medication as prescribed. Recommend over-the-counter Tylenol or ibuprofen as needed for pain, fever, or general discomfort. Increase fluids.  Recommend the use of Pedialyte or Gatorade to prevent dehydration.  Recommend a brat diet to include bananas, rice, applesauce, and toast while nausea and vomiting persist. If you develop a cough, recommend using a humidifier in your bedroom at nighttime during sleep and sleeping elevated on pillows while cough symptoms persist. You should stay home until you have been fever free for 24 hours with no medication. Symptoms should improve over the next 5 to 7 days.  However, if not, or appear to be worsening, you may follow-up in this clinic or with your primary care physician for further evaluation. Follow-up as needed.

## 2023-12-17 ENCOUNTER — Emergency Department (HOSPITAL_COMMUNITY)
Admission: EM | Admit: 2023-12-17 | Discharge: 2023-12-18 | Discharge status: Against Medical Advice | Payer: Medicaid Other | Attending: Emergency Medicine | Admitting: Emergency Medicine

## 2023-12-17 ENCOUNTER — Other Ambulatory Visit: Payer: Self-pay

## 2023-12-17 ENCOUNTER — Encounter (HOSPITAL_COMMUNITY): Payer: Self-pay

## 2023-12-17 DIAGNOSIS — R509 Fever, unspecified: Secondary | ICD-10-CM | POA: Diagnosis not present

## 2023-12-17 DIAGNOSIS — Z5321 Procedure and treatment not carried out due to patient leaving prior to being seen by health care provider: Secondary | ICD-10-CM | POA: Insufficient documentation

## 2023-12-17 DIAGNOSIS — M791 Myalgia, unspecified site: Secondary | ICD-10-CM | POA: Diagnosis not present

## 2023-12-17 DIAGNOSIS — R519 Headache, unspecified: Secondary | ICD-10-CM | POA: Insufficient documentation

## 2023-12-17 NOTE — ED Triage Notes (Signed)
Fever, chills, body aches, headache that started yesterday. Pt fever broke, pt took ibuprofen 1-2 hours ago. Pt afebrile in triage.

## 2023-12-18 ENCOUNTER — Ambulatory Visit: Payer: Self-pay | Admitting: Family Medicine

## 2023-12-18 NOTE — Telephone Encounter (Signed)
Agree with ED recommendation.

## 2023-12-18 NOTE — Telephone Encounter (Signed)
Copied from CRM [id number]. Topic: Clinical - Red Word Triage >> Dec 18, 2023 11:17 AM Geroge Baseman wrote: Red Word that prompted transfer to Nurse Triage: Patient feeling very dizzy, 103.1, feeling very lethargic. Tuesday is when the symptoms started. Hard time breathing. Vomiting.   Chief Complaint: Flu like symptoms  Symptoms: Fever, shortness of breath, headache, dizziness, fatigue  Frequency: Constant  Disposition: [x] ED /[] Urgent Care (no appt availability in office) / [] Appointment(In office/virtual)/ []  [facility]/ [] Home Care/ [] Refused Recommended Disposition /[] [facility]/ []  Follow-up with PCP Additional Notes: Patient's sister called reporting that the patient has had flu like symptoms for the last 2 days. She states that the patient has had a fever of 103.1 F that she has been treating with Tylenol and Ibuprofen which has not been controlling her fever. She states she is also experiencing shortness of breath, vomiting, dizziness, headache, and fatigue. I advised her that the patient should go tot he ED for evaluation and treatment due to her symptoms. She states that the patient went to the ED yesterday but left before being seen. I advised that she should go again today for treatment due to her current symptoms.   Reason for Disposition  [1] Difficulty breathing AND [2] not severe AND [3] not from stuffy nose (e.g., not relieved by cleaning out the nose)  Answer Assessment - Initial Assessment Questions 1. WORST SYMPTOM: "What is your worst symptom?" (e.g., cough, runny nose, muscle aches, headache, sore throat, fever)      Fever 2. ONSET: "When did your flu symptoms start?"      2 days ago  3. COUGH: "How bad is the cough?"       Mild cough  4. RESPIRATORY DISTRESS: "Describe your breathing."      Shortness of breath present  5. FEVER: "Do you have a fever?" If Yes, ask: "What is your temperature, how was it measured, and when did it start?"     103.1  F 6. EXPOSURE: "Were you exposed to someone with influenza?"       No 7. FLU VACCINE: "Did you get a flu shot this year?"     Unsure  8. HIGH RISK DISEASE: "Do you have any chronic medical problems?" (e.g., heart or lung disease, asthma, weak immune system, or other HIGH RISK conditions)     Unsure 9. PREGNANCY: "Is there any chance you are pregnant?" "When was your last menstrual period?"     No 10. OTHER SYMPTOMS: "Do you have any other symptoms?"  (e.g., runny nose, muscle aches, headache, sore throat)       Headache, lightheaded, vomiting  Protocols used: Influenza (Flu) - [facility]

## 2024-01-03 DIAGNOSIS — Z419 Encounter for procedure for purposes other than remedying health state, unspecified: Secondary | ICD-10-CM | POA: Diagnosis not present

## 2024-01-19 DIAGNOSIS — H5213 Myopia, bilateral: Secondary | ICD-10-CM | POA: Diagnosis not present

## 2024-01-21 ENCOUNTER — Encounter: Payer: Self-pay | Admitting: Gastroenterology

## 2024-02-14 DIAGNOSIS — Z419 Encounter for procedure for purposes other than remedying health state, unspecified: Secondary | ICD-10-CM | POA: Diagnosis not present

## 2024-03-15 DIAGNOSIS — Z419 Encounter for procedure for purposes other than remedying health state, unspecified: Secondary | ICD-10-CM | POA: Diagnosis not present

## 2024-03-16 ENCOUNTER — Telehealth: Payer: Self-pay

## 2024-03-16 NOTE — Telephone Encounter (Signed)
 patient is scheduled to follow up with me on 5/22, at which time we will obtain thyroid  labs. Once I review the results, we can proceed with the referral

## 2024-03-16 NOTE — Telephone Encounter (Signed)
 Copied from CRM [id number]. Topic: Referral - Request for Referral >> Mar 16, 2024  1:37 PM Sophia H wrote: Did the patient discuss referral with their provider in the last year? Yes (If No - schedule appointment) (If Yes - send message)  Appointment offered? Yes  Type of order/referral and detailed reason for visit: Patient is requesting a referral to a thyroid  specialist.   Preference of office, provider, location: No preference, just whoever pcp recommends  If referral order, have you been seen by this specialty before? No (If Yes, this issue or another issue? When? Where?  Can we respond through MyChart? Yes

## 2024-03-17 NOTE — Telephone Encounter (Signed)
 Patient advised.

## 2024-03-18 ENCOUNTER — Ambulatory Visit (INDEPENDENT_AMBULATORY_CARE_PROVIDER_SITE_OTHER): Admitting: Gastroenterology

## 2024-03-18 ENCOUNTER — Other Ambulatory Visit

## 2024-03-18 ENCOUNTER — Encounter: Payer: Self-pay | Admitting: Gastroenterology

## 2024-03-18 VITALS — BP 118/68 | HR 102 | Ht 61.0 in | Wt 228.0 lb

## 2024-03-18 DIAGNOSIS — R197 Diarrhea, unspecified: Secondary | ICD-10-CM

## 2024-03-18 DIAGNOSIS — K529 Noninfective gastroenteritis and colitis, unspecified: Secondary | ICD-10-CM | POA: Diagnosis not present

## 2024-03-18 DIAGNOSIS — R195 Other fecal abnormalities: Secondary | ICD-10-CM | POA: Insufficient documentation

## 2024-03-18 MED ORDER — NA SULFATE-K SULFATE-MG SULF 17.5-3.13-1.6 GM/177ML PO SOLN: 1.0000 | Freq: Once | ORAL | 0 refills | Status: AC

## 2024-03-18 NOTE — Patient Instructions (Signed)
 Your provider has requested that you go to the basement level for lab work before leaving today. Press "B" on the elevator. The lab is located at the first door on the left as you exit the elevator.  You have been scheduled for a colonoscopy. Please follow written instructions given to you at your visit today.   If you use inhalers (even only as needed), please bring them with you on the day of your procedure.  DO NOT TAKE 7 DAYS PRIOR TO TEST- Trulicity (dulaglutide) Ozempic, Wegovy (semaglutide) Mounjaro (tirzepatide) Bydureon Bcise (exanatide extended release)  DO NOT TAKE 1 DAY PRIOR TO YOUR TEST Rybelsus (semaglutide) Adlyxin (lixisenatide) Victoza (liraglutide) Byetta (exanatide) ___________________________________________________________________  _______________________________________________________  If your blood pressure at your visit was 140/90 or greater, please contact your primary care physician to follow up on this.  _______________________________________________________  If you are age 83 or older, your body mass index should be between 23-30. Your Body mass index is 43.08 kg/m. If this is out of the aforementioned range listed, please consider follow up with your Primary Care Provider.  If you are age 40 or younger, your body mass index should be between 19-25. Your Body mass index is 43.08 kg/m. If this is out of the aformentioned range listed, please consider follow up with your Primary Care Provider.   ________________________________________________________  The [facility] providers would like to encourage you to use MYCHART to communicate with providers for non-urgent requests or questions.  Due to long hold times on the telephone, sending your provider a message by [facility] may be a faster and more efficient way to get a response.  Please allow 48 business hours for a response.  Please remember that this is for non-urgent requests.   _______________________________________________________

## 2024-03-18 NOTE — Progress Notes (Addendum)
 03/18/2024 Paulette Gaita [id number] 01-29-1991   HISTORY OF PRESENT ILLNESS: This is a 33 year old female who is new to our office.  No GI history.  She is here today for evaluation of diarrhea.  She says that she has had some GI issues at least on and off for years, but for the past few months she has had significant issues with diarrhea.  She said that she had some days where she will have up to 10 bowel movements a day.  Has urgency.  Wakes up at night probably about every other night to have bowel movements.  Says that sometimes she will have a solid stool, but not often.  Stools can vary from normalish to diarrhea within a day.  No significant abdominal pain other than occasional cramps associated with the diarrhea.  She denies seeing blood in her stool but does see mucus.  Says she does not think it seems to be dairy related because she loves milk and drinks it all the time.  TSH was off back in the fall and was started on medication.  She has a follow-up with them for repeat labs in the near future.  No recent abx that she can recall.  Past Medical History:  Diagnosis Date   Asthma    BV (bacterial vaginosis) 06/20/2015   Chlamydia    Hidradenitis    History of chlamydia 07/20/2015   Missed period 12/06/2015   Obesity    PCB (post coital bleeding) 12/06/2015   Thyroiditis    Trichimoniasis 03/08/2022   03/08/22 treated with flagyl    UTI (lower urinary tract infection) 06/20/2015   Vaginal discharge 06/20/2015   Past Surgical History:  Procedure Laterality Date   EYE SURGERY     EYE SURGERY      reports that she has never smoked. She has never used smokeless tobacco. She reports that she does not drink alcohol and does not use drugs. family history includes Cancer in her maternal grandfather, maternal grandmother, paternal grandfather, and paternal grandmother; Heart disease in her mother; Hypertension in her mother; Thyroid  disease in her sister. No Known Allergies     Outpatient Encounter Medications as of 03/18/2024  Medication Sig   Azelastine -Fluticasone  137-50 MCG/ACT SUSP Place 1 spray into the nose every 12 (twelve) hours.   Chlorphen-PE-Acetaminophen  (NOREL AD) 4-10-325 MG TABS Take 1 tablet every 4 hours while symptoms persists. Do not take more than 6 tablets in 24 hours.   levothyroxine  (SYNTHROID ) 50 MCG tablet Take 1 tablet (50 mcg total) by mouth daily.   ondansetron  (ZOFRAN -ODT) 4 MG disintegrating tablet Take 1 tablet (4 mg total) by mouth every 8 (eight) hours as needed.   SUMAtriptan  (IMITREX ) 50 MG tablet Take 1 tablet (50 mg total) by mouth every 12 (twelve) hours as needed for migraine. May repeat in 2 hours if headache persists or recurs.   topiramate  (TOPAMAX ) 25 MG tablet Take 1 tablet (25 mg total) by mouth daily.   No facility-administered encounter medications on file as of 03/18/2024.    REVIEW OF SYSTEMS  : All other systems reviewed and negative except where noted in the History of Present Illness.   PHYSICAL EXAM: BP 118/68   Pulse (!) 102   Ht 5\' 1"  (1.549 m)   Wt 228 lb (103.4 kg)   BMI 43.08 kg/m  General: Well developed white female in no acute distress Head: Normocephalic and atraumatic Eyes:  Sclerae anicteric, conjunctiva pink. Ears: Normal auditory acuity Lungs: Clear throughout to  auscultation; no W/R/R. Heart: Regular rate and rhythm; no M/R/G. Rectal:  Will be done at the time of colonoscopy. Musculoskeletal: Symmetrical with no gross deformities  Skin: No lesions on visible extremities Extremities: No edema  Neurological: Alert oriented x 4, grossly non-focal Psychological:  Alert and cooperative. Normal mood and affect  ASSESSMENT AND PLAN: 33 year old female with complaints of diarrhea for the past few months.  Reports some longstanding GI issues on and off, but diarrhea has been significant the past few months.  Also has mucus, but no bleeding.  Will check celiac labs, C. difficile, fecal  calprotectin.  Pending that the labs are normal/negative we will proceed with colonoscopy to rule out IBD, etc.  Will schedule with Dr. Brice Campi.  The risks, benefits, and alternatives to colonoscopy were discussed with the patient and she consents to proceed.  Could be a component of IBS.  Could also be lactose intolerance as she does drink milk on a daily basis.   CC:  Del Amber Bail, Ilian*

## 2024-03-19 LAB — CLOSTRIDIUM DIFFICILE TOXIN B, QUALITATIVE, REAL-TIME PCR: Toxigenic C. Difficile by PCR: NOT DETECTED

## 2024-03-20 LAB — TISSUE TRANSGLUTAMINASE ABS,IGG,IGA
(tTG) Ab, IgA: <1 U/mL
(tTG) Ab, IgG: <1 U/mL

## 2024-03-20 LAB — IGA: Immunoglobulin A: 478 mg/dL — ABNORMAL HIGH (ref 47–310)

## 2024-03-20 NOTE — Progress Notes (Signed)
 Attending Physician's Attestation   I have reviewed the chart.   I agree with the Advanced Practitioner's note, impression, and recommendations with any updates as below.    Corliss Parish, MD [facility]oscopy Office # [phone]

## 2024-03-21 LAB — CALPROTECTIN, FECAL: Calprotectin, Fecal: 8 ug/g (ref 0–120)

## 2024-03-22 ENCOUNTER — Ambulatory Visit: Payer: Self-pay | Admitting: Gastroenterology

## 2024-03-25 ENCOUNTER — Ambulatory Visit (INDEPENDENT_AMBULATORY_CARE_PROVIDER_SITE_OTHER): Payer: 59 | Admitting: Family Medicine

## 2024-03-25 ENCOUNTER — Encounter: Payer: Self-pay | Admitting: Family Medicine

## 2024-03-25 VITALS — BP 117/76 | HR 83 | Ht 61.0 in | Wt 227.8 lb

## 2024-03-25 DIAGNOSIS — E559 Vitamin D deficiency, unspecified: Secondary | ICD-10-CM | POA: Diagnosis not present

## 2024-03-25 DIAGNOSIS — E039 Hypothyroidism, unspecified: Secondary | ICD-10-CM | POA: Diagnosis not present

## 2024-03-25 DIAGNOSIS — E782 Mixed hyperlipidemia: Secondary | ICD-10-CM

## 2024-03-25 DIAGNOSIS — R7303 Prediabetes: Secondary | ICD-10-CM | POA: Diagnosis not present

## 2024-03-25 HISTORY — DX: Morbid (severe) obesity due to excess calories: E66.01

## 2024-03-25 NOTE — Progress Notes (Signed)
 Established Patient Office Visit   Subjective  Patient ID: Diane Parks, female    DOB: 08/13/1991  Age: 33 y.o. MRN: [id number]  Chief Complaint  Patient presents with   Hyperlipidemia   Hypothyroidism    She  has a past medical history of Asthma, BV (bacterial vaginosis) (06/20/2015), Chlamydia, Hidradenitis, History of chlamydia (07/20/2015), Missed period (12/06/2015), Obesity, PCB (post coital bleeding) (12/06/2015), Thyroiditis, Trichimoniasis (03/08/2022), UTI (lower urinary tract infection) (06/20/2015), and Vaginal discharge (06/20/2015).  HPI Patient presents to the clinic for follow up. For the details of today's visit, please refer to assessment and plan.   Review of Systems  Constitutional:  Negative for chills and fever.  Eyes:  Negative for blurred vision.  Respiratory:  Negative for shortness of breath.   Cardiovascular:  Negative for chest pain.  Gastrointestinal:  Negative for abdominal pain.  Genitourinary:  Negative for dysuria.  Neurological:  Negative for dizziness and headaches.      Objective:     BP 117/76   Pulse 83   Ht 5\' 1"  (1.549 m)   Wt 227 lb 12 oz (103.3 kg)   SpO2 97%   BMI 43.03 kg/m  BP Readings from Last 3 Encounters:  03/25/24 117/76  03/18/24 118/68  12/17/23 (!) 102/56      Physical Exam Vitals reviewed.  Constitutional:      General: She is not in acute distress.    Appearance: Normal appearance. She is not ill-appearing, toxic-appearing or diaphoretic.  HENT:     Head: Normocephalic.  Eyes:     General:        Right eye: No discharge.        Left eye: No discharge.     Conjunctiva/sclera: Conjunctivae normal.  Cardiovascular:     Rate and Rhythm: Normal rate.     Pulses: Normal pulses.     Heart sounds: Normal heart sounds.  Pulmonary:     Effort: Pulmonary effort is normal. No respiratory distress.     Breath sounds: Normal breath sounds.  Abdominal:     General: Bowel sounds are normal.     Palpations:  Abdomen is soft.     Tenderness: There is no abdominal tenderness. There is no right CVA tenderness, left CVA tenderness or guarding.  Skin:    General: Skin is warm and dry.  Neurological:     Mental Status: She is alert.     Coordination: Coordination normal.     Gait: Gait normal.  Psychiatric:        Mood and Affect: Mood normal.        Behavior: Behavior normal.      No results found for any visits on 03/25/24.  The ASCVD Risk score (Arnett DK, et al., 2019) failed to calculate for the following reasons:   The 2019 ASCVD risk score is only valid for ages 30 to 58    Assessment & Plan:  Acquired hypothyroidism Assessment & Plan: Previous TSH 14.8 Repeat thyroid  panel done today Patient reports taking synthroid  50 mcg once daily   Orders: -     TSH + free T4  Mixed hyperlipidemia -     Lipid panel -     BMP8+eGFR -     CBC with Differential/Platelet  Vitamin D  deficiency -     VITAMIN D  25 Hydroxy (Vit-D Deficiency, Fractures)  Prediabetes Assessment & Plan: Last Hemoglobin A1c: 6.0 Labs: Ordered today, results pending; will follow up accordingly. Reviewed non-pharmacological interventions, including a balanced diet  rich in lean proteins, healthy fats, whole grains, and high-fiber vegetables. Emphasized reducing refined sugars and processed carbohydrates, and incorporating more fruits, leafy greens, and legumes.   Orders: -     Hemoglobin A1c    Return in about 6 months (around 09/25/2024), or if symptoms worsen or fail to improve, for thyroid , pre-diabetes, hyperlipidemia.   Avelino Lek Amber Bail, FNP

## 2024-03-25 NOTE — Assessment & Plan Note (Signed)
 Last Hemoglobin A1c: 6.0 Labs: Ordered today, results pending; will follow up accordingly. Reviewed non-pharmacological interventions, including a balanced diet rich in lean proteins, healthy fats, whole grains, and high-fiber vegetables. Emphasized reducing refined sugars and processed carbohydrates, and incorporating more fruits, leafy greens, and legumes.

## 2024-03-25 NOTE — Patient Instructions (Signed)

## 2024-03-25 NOTE — Assessment & Plan Note (Signed)
 Previous TSH 14.8 Repeat thyroid  panel done today Patient reports taking synthroid  50 mcg once daily

## 2024-03-26 ENCOUNTER — Ambulatory Visit: Payer: Self-pay | Admitting: Family Medicine

## 2024-03-26 ENCOUNTER — Other Ambulatory Visit: Payer: Self-pay | Admitting: Family Medicine

## 2024-03-26 DIAGNOSIS — E119 Type 2 diabetes mellitus without complications: Secondary | ICD-10-CM

## 2024-03-26 DIAGNOSIS — E039 Hypothyroidism, unspecified: Secondary | ICD-10-CM

## 2024-03-26 DIAGNOSIS — E782 Mixed hyperlipidemia: Secondary | ICD-10-CM

## 2024-03-26 LAB — HEMOGLOBIN A1C
Est. average glucose Bld gHb Est-mCnc: 143 mg/dL
Hgb A1c MFr Bld: 6.6 % — ABNORMAL HIGH (ref 4.8–5.6)

## 2024-03-26 LAB — LIPID PANEL
Chol/HDL Ratio: 5 ratio — ABNORMAL HIGH (ref 0.0–4.4)
Cholesterol, Total: 181 mg/dL (ref 100–199)
HDL: 36 mg/dL — ABNORMAL LOW (ref >39)
LDL Chol Calc (NIH): 109 mg/dL — ABNORMAL HIGH (ref 0–99)
Triglycerides: 207 mg/dL — ABNORMAL HIGH (ref 0–149)
VLDL Cholesterol Cal: 36 mg/dL (ref 5–40)

## 2024-03-26 LAB — CBC WITH DIFFERENTIAL/PLATELET
Basophils Absolute: 0.1 10*3/uL (ref 0.0–0.2)
Basos: 1 %
EOS (ABSOLUTE): 0.2 10*3/uL (ref 0.0–0.4)
Eos: 2 %
Hematocrit: 43 % (ref 34.0–46.6)
Hemoglobin: 14.2 g/dL (ref 11.1–15.9)
Immature Grans (Abs): 0 10*3/uL (ref 0.0–0.1)
Immature Granulocytes: 0 %
Lymphocytes Absolute: 3.8 10*3/uL — ABNORMAL HIGH (ref 0.7–3.1)
Lymphs: 40 %
MCH: 27.3 pg (ref 26.6–33.0)
MCHC: 33 g/dL (ref 31.5–35.7)
MCV: 83 fL (ref 79–97)
Monocytes Absolute: 0.7 10*3/uL (ref 0.1–0.9)
Monocytes: 8 %
Neutrophils Absolute: 4.8 10*3/uL (ref 1.4–7.0)
Neutrophils: 49 %
Platelets: 372 10*3/uL (ref 150–450)
RBC: 5.2 x10E6/uL (ref 3.77–5.28)
RDW: 13 % (ref 11.7–15.4)
WBC: 9.5 10*3/uL (ref 3.4–10.8)

## 2024-03-26 LAB — BMP8+EGFR
BUN/Creatinine Ratio: 15 (ref 9–23)
BUN: 9 mg/dL (ref 6–20)
CO2: 20 mmol/L (ref 20–29)
Calcium: 9.4 mg/dL (ref 8.7–10.2)
Chloride: 97 mmol/L (ref 96–106)
Creatinine, Ser: 0.6 mg/dL (ref 0.57–1.00)
Glucose: 109 mg/dL — ABNORMAL HIGH (ref 70–99)
Potassium: 5 mmol/L (ref 3.5–5.2)
Sodium: 136 mmol/L (ref 134–144)
eGFR: 124 mL/min/{1.73_m2} (ref >59)

## 2024-03-26 LAB — VITAMIN D 25 HYDROXY (VIT D DEFICIENCY, FRACTURES): Vit D, 25-Hydroxy: 21.3 ng/mL — ABNORMAL LOW (ref 30.0–100.0)

## 2024-03-26 LAB — TSH+FREE T4
Free T4: 1.14 ng/dL (ref 0.82–1.77)
TSH: 6.28 u[IU]/mL — ABNORMAL HIGH (ref 0.450–4.500)

## 2024-03-26 MED ORDER — LEVOTHYROXINE SODIUM 75 MCG PO TABS: 75.0000 ug | ORAL_TABLET | Freq: Every day | ORAL | 1 refills | Status: AC

## 2024-03-26 MED ORDER — ROSUVASTATIN CALCIUM 5 MG PO TABS: 5.0000 mg | ORAL_TABLET | Freq: Every day | ORAL | 3 refills | Status: AC

## 2024-03-26 MED ORDER — METFORMIN HCL ER 500 MG PO TB24: 500.0000 mg | ORAL_TABLET | Freq: Every day | ORAL | 2 refills | Status: AC

## 2024-04-11 ENCOUNTER — Encounter: Payer: Self-pay | Admitting: Gastroenterology

## 2024-04-12 ENCOUNTER — Telehealth: Payer: Self-pay | Admitting: Gastroenterology

## 2024-04-12 MED ORDER — GOLYTELY 236 G PO SOLR: 4000.0000 mL | Freq: Once | ORAL | 0 refills | Status: AC

## 2024-04-12 NOTE — Telephone Encounter (Signed)
 Patient calling in regards to previous note. Please advise.   Thank you

## 2024-04-12 NOTE — Telephone Encounter (Signed)
 Spoke with Pattie Borders at [facility] and sent in script for Golytely.

## 2024-04-12 NOTE — Telephone Encounter (Signed)
 Spoke with patient and patient would like to proceed with new script.

## 2024-04-12 NOTE — Telephone Encounter (Signed)
 [facility] pharmacist called and stated that this patient Su prep medication is not cover by her insurance and they are requesting that we send over a new prep medication and is 4 liters. A good call back number for them is [phone] ask for [facility]. Please advise.

## 2024-04-12 NOTE — Telephone Encounter (Signed)
 [facility] rep Fabio Holts is calling back to find out if we have sent over a new prescription for the patient prep medication. A Good call back number is [phone] ask for [facility]. Please advise.

## 2024-04-13 ENCOUNTER — Telehealth: Payer: Self-pay | Admitting: Gastroenterology

## 2024-04-13 MED ORDER — ONDANSETRON HCL 4 MG PO TABS: 4.0000 mg | ORAL_TABLET | Freq: Two times a day (BID) | ORAL | 0 refills | Status: AC

## 2024-04-13 NOTE — Telephone Encounter (Signed)
 Patient called and stated that she is scheduled for a colonoscopy tomorrow with Dr. Brice Campi. Patient stated that she has started taken her prep medication and started feeling sick. Patient is requesting a call back. Please advise.

## 2024-04-13 NOTE — Telephone Encounter (Signed)
 Returned call to patient. She reports vomiting after starting her colon prep. Dr.Mansouraty notified. Order received for Zofran  4 mg PO BID. Instructed patient to take medication before starting the prep and then another tablet  1-2 hrs later. Encouraged patient to drink  plenty of water and call back with any questions or concerns. Patient verbalized understanding and agreed to POC.

## 2024-04-14 ENCOUNTER — Ambulatory Visit (AMBULATORY_SURGERY_CENTER): Admitting: Gastroenterology

## 2024-04-14 ENCOUNTER — Encounter: Payer: Self-pay | Admitting: Gastroenterology

## 2024-04-14 VITALS — BP 122/89 | HR 81 | Temp 97.4°F | Resp 12 | Ht 61.0 in | Wt 228.0 lb

## 2024-04-14 DIAGNOSIS — K644 Residual hemorrhoidal skin tags: Secondary | ICD-10-CM | POA: Diagnosis not present

## 2024-04-14 DIAGNOSIS — K639 Disease of intestine, unspecified: Secondary | ICD-10-CM | POA: Diagnosis not present

## 2024-04-14 DIAGNOSIS — K640 First degree hemorrhoids: Secondary | ICD-10-CM

## 2024-04-14 DIAGNOSIS — J45909 Unspecified asthma, uncomplicated: Secondary | ICD-10-CM | POA: Diagnosis not present

## 2024-04-14 DIAGNOSIS — R197 Diarrhea, unspecified: Secondary | ICD-10-CM | POA: Diagnosis not present

## 2024-04-14 DIAGNOSIS — R194 Change in bowel habit: Secondary | ICD-10-CM

## 2024-04-14 MED ORDER — SODIUM CHLORIDE 0.9 % IV SOLN: 500.0000 mL | Freq: Once | INTRAVENOUS | Status: AC

## 2024-04-14 NOTE — Progress Notes (Signed)
 Pt's states no medical or surgical changes since previsit or office visit.

## 2024-04-14 NOTE — Patient Instructions (Addendum)
 Resume previous diet and medications.  Repeat colonoscopy at age 33 for screening.  Pathology results will be sent via MyChart or letter.    YOU HAD AN ENDOSCOPIC PROCEDURE TODAY AT THE  ENDOSCOPY CENTER:   Refer to the procedure report that was given to you for any specific questions about what was found during the examination.  If the procedure report does not answer your questions, please call your gastroenterologist to clarify.  If you requested that your care partner not be given the details of your procedure findings, then the procedure report has been included in a sealed envelope for you to review at your convenience later.  YOU SHOULD EXPECT: Some feelings of bloating in the abdomen. Passage of more gas than usual.  Walking can help get rid of the air that was put into your GI tract during the procedure and reduce the bloating. If you had a lower endoscopy (such as a colonoscopy or flexible sigmoidoscopy) you may notice spotting of blood in your stool or on the toilet paper. If you underwent a bowel prep for your procedure, you may not have a normal bowel movement for a few days.  Please Note:  You might notice some irritation and congestion in your nose or some drainage.  This is from the oxygen used during your procedure.  There is no need for concern and it should clear up in a day or so.  SYMPTOMS TO REPORT IMMEDIATELY:  Following lower endoscopy (colonoscopy or flexible sigmoidoscopy):  Excessive amounts of blood in the stool  Significant tenderness or worsening of abdominal pains  Swelling of the abdomen that is new, acute  Fever of 100F or higher  For urgent or emergent issues, a gastroenterologist can be reached at any hour by calling [phone]. Do not use MyChart messaging for urgent concerns.    DIET:  We do recommend a small meal at first, but then you may proceed to your regular diet.  Drink plenty of fluids but you should avoid alcoholic beverages for 24  hours.  ACTIVITY:  You should plan to take it easy for the rest of today and you should NOT DRIVE or use heavy machinery until tomorrow (because of the sedation medicines used during the test).    FOLLOW UP: Our staff will call the number listed on your records the next business day following your procedure.  We will call around 7:15- 8:00 am to check on you and address any questions or concerns that you may have regarding the information given to you following your procedure. If we do not reach you, we will leave a message.     If any biopsies were taken you will be contacted by phone or by letter within the next 1-3 weeks.  Please call us  at [phone] if you have not heard about the biopsies in 3 weeks.    SIGNATURES/CONFIDENTIALITY: You and/or your care partner have signed paperwork which will be entered into your electronic medical record.  These signatures attest to the fact that that the information above on your After Visit Summary has been reviewed and is understood.  Full responsibility of the confidentiality of this discharge information lies with you and/or your care-partner.

## 2024-04-14 NOTE — Progress Notes (Signed)
 Report given to PACU, vss

## 2024-04-14 NOTE — Progress Notes (Signed)
 5784 Nasopharyngeal airway size 7.0  placed without trauma, vss

## 2024-04-14 NOTE — Op Note (Signed)
 [facility] Patient Name: Diane Parks Procedure Date: 04/14/2024 9:46 AM MRN: [id number] Endoscopist: Yong Henle , MD, [id number] Age: 33 Referring MD:  Date of Birth: Aug 22, 1991 Gender: Female Account #: [id number] Procedure:                Colonoscopy Indications:              Change in bowel habits, Diarrhea Medicines:                Monitored Anesthesia Care Procedure:                Pre-Anesthesia Assessment:                           - Prior to the procedure, a History and Physical                            was performed, and patient medications and                            allergies were reviewed. The patient's tolerance of                            previous anesthesia was also reviewed. The risks                            and benefits of the procedure and the sedation                            options and risks were discussed with the patient.                            All questions were answered, and informed consent                            was obtained. Prior Anticoagulants: The patient has                            taken no anticoagulant or antiplatelet agents. ASA                            Grade Assessment: III - A patient with severe                            systemic disease. After reviewing the risks and                            benefits, the patient was deemed in satisfactory                            condition to undergo the procedure.                           After obtaining informed consent, the colonoscope  was passed under direct vision. Throughout the                            procedure, the patient's blood pressure, pulse, and                            oxygen saturations were monitored continuously. The                            Colonoscope was introduced through the anus and                            advanced to the 3 cm into the ileum. The                            colonoscopy was performed  without difficulty. The                            patient tolerated the procedure. The quality of the                            bowel preparation was adequate. The terminal ileum,                            ileocecal valve, appendiceal orifice, and rectum                            were photographed. Scope In: 9:51:08 AM Scope Out: 10:01:55 AM Scope Withdrawal Time: 0 hours 8 minutes 45 seconds  Total Procedure Duration: 0 hours 10 minutes 47 seconds  Findings:                 Skin tags were found on perianal exam.                           The digital rectal exam findings include                            hemorrhoids. Pertinent negatives include no                            palpable rectal lesions.                           The terminal ileum and ileocecal valve appeared                            normal.                           Normal mucosa was found in the entire colon.                            Biopsies for histology were taken with a cold  forceps from the entire colon for evaluation of                            microscopic colitis.                           Non-bleeding non-thrombosed external and internal                            hemorrhoids were found during retroflexion, during                            perianal exam and during digital exam. The                            hemorrhoids were Grade I (internal hemorrhoids that                            do not prolapse). Complications:            No immediate complications. Estimated Blood Loss:     Estimated blood loss was minimal. Impression:               - Perianal skin tags found on perianal exam.                           - Hemorrhoids found on digital rectal exam.                           - The examined portion of the ileum was normal.                           - Normal mucosa in the entire examined colon.                            Biopsied.                           - Non-bleeding  non-thrombosed external and internal                            hemorrhoids. Recommendation:           - The patient will be observed post-procedure,                            until all discharge criteria are met.                           - Discharge patient to home.                           - Patient has a contact number available for                            emergencies. The signs and symptoms of potential  delayed complications were discussed with the                            patient. Return to normal activities tomorrow.                            Written discharge instructions were provided to the                            patient.                           - High fiber diet.                           - Continue present medications.                           - Await pathology results.                           - Repeat colonoscopy for screening purposes would                            be due at age 52 if no evidence of chronic colitis                            is noted on pathology.                           - Additional workup will be considered based on                            final pathology. Query IBS?"D therapy with Xifaxan                            and SIBO rule out as well as fecal elastase testing                            in future.                           - The findings and recommendations were discussed                            with the patient.                           - The findings and recommendations were discussed                            with the patient's family. Yong Henle, MD 04/14/2024 10:07:31 AM

## 2024-04-14 NOTE — Progress Notes (Signed)
 GASTROENTEROLOGY PROCEDURE H&P NOTE   Primary Care Physician: Margaret Sharp, PA-C  HPI: Diane Parks is a 33 y.o. female who presents for Colonoscopy for evaluation of changes in bowel habits and rule out IBD.  Past Medical History:  Diagnosis Date   Asthma    BV (bacterial vaginosis) 06/20/2015   Chlamydia    Hidradenitis    History of chlamydia 07/20/2015   Missed period 12/06/2015   Morbid (severe) obesity due to excess calories (HCC) 03/25/2024   bmi 43.03   Obesity    PCB (post coital bleeding) 12/06/2015   Thyroiditis    Trichimoniasis 03/08/2022   03/08/22 treated with flagyl    UTI (lower urinary tract infection) 06/20/2015   Vaginal discharge 06/20/2015   Past Surgical History:  Procedure Laterality Date   EYE SURGERY     EYE SURGERY     Current Outpatient Medications  Medication Sig Dispense Refill   levothyroxine  (SYNTHROID ) 75 MCG tablet Take 1 tablet (75 mcg total) by mouth daily. 90 tablet 1   metFORMIN  (GLUCOPHAGE -XR) 500 MG 24 hr tablet Take 1 tablet (500 mg total) by mouth daily with breakfast. 90 tablet 2   rosuvastatin  (CRESTOR ) 5 MG tablet Take 1 tablet (5 mg total) by mouth daily. 90 tablet 3   tirzepatide (MOUNJARO) 2.5 MG/0.5ML Pen Inject 2.5 mg into the skin.     Azelastine -Fluticasone  137-50 MCG/ACT SUSP Place 1 spray into the nose every 12 (twelve) hours. 23 g 1   Chlorphen-PE-Acetaminophen  (NOREL AD) 4-10-325 MG TABS Take 1 tablet every 4 hours while symptoms persists. Do not take more than 6 tablets in 24 hours. 20 tablet 0   ondansetron  (ZOFRAN ) 4 MG tablet Take 1 tablet (4 mg total) by mouth in the morning and at bedtime. 4 tablet 0   ondansetron  (ZOFRAN -ODT) 4 MG disintegrating tablet Take 1 tablet (4 mg total) by mouth every 8 (eight) hours as needed. 20 tablet 0   progesterone (PROMETRIUM) 200 MG capsule Take 200 mg by mouth at bedtime.     SUMAtriptan  (IMITREX ) 50 MG tablet Take 1 tablet (50 mg total) by mouth every 12 (twelve) hours  as needed for migraine. May repeat in 2 hours if headache persists or recurs. 10 tablet 0   topiramate  (TOPAMAX ) 25 MG tablet Take 1 tablet (25 mg total) by mouth daily. 30 tablet 3   Current Facility-Administered Medications  Medication Dose Route Frequency Provider Last Rate Last Admin   0.9 %  sodium chloride  infusion  500 mL Intravenous Once Mansouraty, Julianah Marciel Jr., MD        Current Outpatient Medications:    levothyroxine  (SYNTHROID ) 75 MCG tablet, Take 1 tablet (75 mcg total) by mouth daily., Disp: 90 tablet, Rfl: 1   metFORMIN  (GLUCOPHAGE -XR) 500 MG 24 hr tablet, Take 1 tablet (500 mg total) by mouth daily with breakfast., Disp: 90 tablet, Rfl: 2   rosuvastatin  (CRESTOR ) 5 MG tablet, Take 1 tablet (5 mg total) by mouth daily., Disp: 90 tablet, Rfl: 3   tirzepatide (MOUNJARO) 2.5 MG/0.5ML Pen, Inject 2.5 mg into the skin., Disp: , Rfl:    Azelastine -Fluticasone  137-50 MCG/ACT SUSP, Place 1 spray into the nose every 12 (twelve) hours., Disp: 23 g, Rfl: 1   Chlorphen-PE-Acetaminophen  (NOREL AD) 4-10-325 MG TABS, Take 1 tablet every 4 hours while symptoms persists. Do not take more than 6 tablets in 24 hours., Disp: 20 tablet, Rfl: 0   ondansetron  (ZOFRAN ) 4 MG tablet, Take 1 tablet (4 mg total) by mouth in  the morning and at bedtime., Disp: 4 tablet, Rfl: 0   ondansetron  (ZOFRAN -ODT) 4 MG disintegrating tablet, Take 1 tablet (4 mg total) by mouth every 8 (eight) hours as needed., Disp: 20 tablet, Rfl: 0   progesterone (PROMETRIUM) 200 MG capsule, Take 200 mg by mouth at bedtime., Disp: , Rfl:    SUMAtriptan  (IMITREX ) 50 MG tablet, Take 1 tablet (50 mg total) by mouth every 12 (twelve) hours as needed for migraine. May repeat in 2 hours if headache persists or recurs., Disp: 10 tablet, Rfl: 0   topiramate  (TOPAMAX ) 25 MG tablet, Take 1 tablet (25 mg total) by mouth daily., Disp: 30 tablet, Rfl: 3  Current Facility-Administered Medications:    0.9 %  sodium chloride  infusion, 500 mL,  Intravenous, Once, Mansouraty, Albino Alu., MD No Known Allergies Family History  Problem Relation Age of Onset   Hypertension Mother    Heart disease Mother    Thyroid  disease Sister    Stomach cancer Paternal Uncle    Cancer Maternal Grandmother    Cancer Maternal Grandfather    Cancer Paternal Grandmother    Cancer Paternal Grandfather    Esophageal cancer Neg Hx    Liver disease Neg Hx    Colon cancer Neg Hx    Rectal cancer Neg Hx    Social History   Socioeconomic History   Marital status: Single    Spouse name: Not on file   Number of children: 0   Years of education: Not on file   Highest education level: 9th grade  Occupational History   Not on file  Tobacco Use   Smoking status: Never   Smokeless tobacco: Never  Vaping Use   Vaping status: Never Used  Substance and Sexual Activity   Alcohol use: No   Drug use: No   Sexual activity: Yes    Birth control/protection: Condom, None  Other Topics Concern   Not on file  Social History Narrative   Not on file   Social Drivers of Health   Financial Resource Strain: Low Risk  (03/22/2024)   Overall Financial Resource Strain (CARDIA)    Difficulty of Paying Living Expenses: Not hard at all  Food Insecurity: Low Risk  (04/09/2024)   Received from [facility]   Hunger Vital Sign    Worried About Running Out of Food in the Last Year: Never true    Ran Out of Food in the Last Year: Never true  Transportation Needs: No Transportation Needs (04/09/2024)   Received from [facility]    In the past 12 months, has lack of reliable transportation kept you from medical appointments, meetings, work or from getting things needed for daily living? : No  Physical Activity: Unknown (03/22/2024)   Exercise Vital Sign    Days of Exercise per Week: 3 days    Minutes of Exercise per Session: Patient declined  Stress: No Stress Concern Present (03/22/2024)   [employer] of Occupational Health - Occupational  Stress Questionnaire    Feeling of Stress : Not at all  Social Connections: Unknown (03/22/2024)   Social Connection and Isolation Panel [NHANES]    Frequency of Communication with Friends and Family: More than three times a week    Frequency of Social Gatherings with Friends and Family: More than three times a week    Attends Religious Services: Patient declined    Active Member of Clubs or Organizations: No    Attends Banker Meetings: Not on file  Marital Status: Never married  Intimate Partner Violence: Not At Risk (09/10/2023)   Received from [facility]   Humiliation, Afraid, Rape, and Kick questionnaire    Fear of Current or Ex-Partner: No    Emotionally Abused: No    Physically Abused: No    Sexually Abused: No    Physical Exam: Today's Vitals   04/14/24 0851  BP: 129/75  Pulse: 75  Temp: (!) 97.4 F (36.3 C)  SpO2: 96%  Weight: 228 lb (103.4 kg)  Height: 5' 1 (1.549 m)   Body mass index is 43.08 kg/m. GEN: NAD EYE: Sclerae anicteric ENT: MMM CV: Non-tachycardic GI: Soft, NT/ND NEURO:  Alert & Oriented x 3  Lab Results: No results for input(s): WBC, HGB, HCT, PLT in the last 72 hours. BMET No results for input(s): NA, K, CL, CO2, GLUCOSE, BUN, CREATININE, CALCIUM  in the last 72 hours. LFT No results for input(s): PROT, ALBUMIN, AST, ALT, ALKPHOS, BILITOT, BILIDIR, IBILI in the last 72 hours. PT/INR No results for input(s): LABPROT, INR in the last 72 hours.   Impression / Plan: This is a 33 y.o.female who presents for Colonoscopy for evaluation of changes in bowel habits and rule out IBD.  The risks and benefits of endoscopic evaluation/treatment were discussed with the patient and/or family; these include but are not limited to the risk of perforation, infection, bleeding, missed lesions, lack of diagnosis, severe illness requiring hospitalization, as well as anesthesia and sedation related  illnesses.  The patient's history has been reviewed, patient examined, no change in status, and deemed stable for procedure.  The patient and/or family is agreeable to proceed.    Yong Henle, MD [facility] Office # [phone]

## 2024-04-15 ENCOUNTER — Telehealth: Payer: Self-pay

## 2024-04-15 DIAGNOSIS — Z419 Encounter for procedure for purposes other than remedying health state, unspecified: Secondary | ICD-10-CM | POA: Diagnosis not present

## 2024-04-15 NOTE — Telephone Encounter (Signed)
  Follow up Call-     04/14/2024    8:52 AM  Call back number  Post procedure Call Back phone  # [phone]  Permission to leave phone message Yes   Follow up call, LVM.

## 2024-04-17 ENCOUNTER — Ambulatory Visit: Payer: Self-pay | Admitting: Gastroenterology

## 2024-05-15 DIAGNOSIS — Z419 Encounter for procedure for purposes other than remedying health state, unspecified: Secondary | ICD-10-CM | POA: Diagnosis not present

## 2024-06-15 DIAGNOSIS — Z419 Encounter for procedure for purposes other than remedying health state, unspecified: Secondary | ICD-10-CM | POA: Diagnosis not present

## 2024-07-13 DIAGNOSIS — G4733 Obstructive sleep apnea (adult) (pediatric): Secondary | ICD-10-CM | POA: Diagnosis not present

## 2024-07-15 DIAGNOSIS — G4733 Obstructive sleep apnea (adult) (pediatric): Secondary | ICD-10-CM | POA: Diagnosis not present

## 2024-07-16 DIAGNOSIS — Z419 Encounter for procedure for purposes other than remedying health state, unspecified: Secondary | ICD-10-CM | POA: Diagnosis not present

## 2024-08-15 DIAGNOSIS — Z419 Encounter for procedure for purposes other than remedying health state, unspecified: Secondary | ICD-10-CM | POA: Diagnosis not present

## 2024-08-17 ENCOUNTER — Encounter: Payer: Self-pay | Admitting: Gastroenterology

## 2024-09-15 DIAGNOSIS — Z419 Encounter for procedure for purposes other than remedying health state, unspecified: Secondary | ICD-10-CM | POA: Diagnosis not present

## 2024-09-27 ENCOUNTER — Ambulatory Visit: Admitting: Family Medicine

## 2024-10-15 DIAGNOSIS — Z419 Encounter for procedure for purposes other than remedying health state, unspecified: Secondary | ICD-10-CM | POA: Diagnosis not present

## 2024-10-16 ENCOUNTER — Emergency Department (HOSPITAL_COMMUNITY)

## 2024-10-16 ENCOUNTER — Other Ambulatory Visit: Payer: Self-pay

## 2024-10-16 ENCOUNTER — Emergency Department (HOSPITAL_COMMUNITY)
Admission: EM | Admit: 2024-10-16 | Discharge: 2024-10-16 | Disposition: A | Discharge status: Home / Self Care | Attending: Emergency Medicine | Admitting: Emergency Medicine

## 2024-10-16 ENCOUNTER — Encounter (HOSPITAL_COMMUNITY): Payer: Self-pay

## 2024-10-16 DIAGNOSIS — M545 Low back pain, unspecified: Secondary | ICD-10-CM | POA: Diagnosis not present

## 2024-10-16 DIAGNOSIS — S3011XA Contusion of abdominal wall, initial encounter: Secondary | ICD-10-CM | POA: Diagnosis not present

## 2024-10-16 DIAGNOSIS — Y92410 Unspecified street and highway as the place of occurrence of the external cause: Secondary | ICD-10-CM | POA: Insufficient documentation

## 2024-10-16 DIAGNOSIS — M25532 Pain in left wrist: Secondary | ICD-10-CM | POA: Insufficient documentation

## 2024-10-16 LAB — CBC WITH DIFFERENTIAL/PLATELET
Abs Immature Granulocytes: 0.03 K/uL (ref 0.00–0.07)
Basophils Absolute: 0 K/uL (ref 0.0–0.1)
Basophils Relative: 0 %
Eosinophils Absolute: 0.3 K/uL (ref 0.0–0.5)
Eosinophils Relative: 2 %
HCT: 40.2 % (ref 36.0–46.0)
Hemoglobin: 13 g/dL (ref 12.0–15.0)
Immature Granulocytes: 0 %
Lymphocytes Relative: 43 %
Lymphs Abs: 5.4 K/uL — ABNORMAL HIGH (ref 0.7–4.0)
MCH: 27.1 pg (ref 26.0–34.0)
MCHC: 32.3 g/dL (ref 30.0–36.0)
MCV: 83.8 fL (ref 80.0–100.0)
Monocytes Absolute: 0.9 K/uL (ref 0.1–1.0)
Monocytes Relative: 7 %
Neutro Abs: 5.8 K/uL (ref 1.7–7.7)
Neutrophils Relative %: 48 %
Platelets: 346 K/uL (ref 150–400)
RBC: 4.8 MIL/uL (ref 3.87–5.11)
RDW: 13.6 % (ref 11.5–15.5)
WBC: 12.4 K/uL — ABNORMAL HIGH (ref 4.0–10.5)
nRBC: 0 % (ref 0.0–0.2)

## 2024-10-16 LAB — BASIC METABOLIC PANEL WITH GFR
Anion gap: 14 (ref 5–15)
BUN: 9 mg/dL (ref 6–20)
CO2: 23 mmol/L (ref 22–32)
Calcium: 9 mg/dL (ref 8.9–10.3)
Chloride: 102 mmol/L (ref 98–111)
Creatinine, Ser: 0.65 mg/dL (ref 0.44–1.00)
GFR, Estimated: >60 mL/min (ref >60)
Glucose, Bld: 107 mg/dL — ABNORMAL HIGH (ref 70–99)
Potassium: 3.5 mmol/L (ref 3.5–5.1)
Sodium: 139 mmol/L (ref 135–145)

## 2024-10-16 MED ORDER — IBUPROFEN 800 MG PO TABS
800.0000 mg | ORAL_TABLET | Freq: Once | ORAL | Status: AC
  Administered 2024-10-16: 800 mg via ORAL
  Filled 2024-10-16: qty 1

## 2024-10-16 MED ORDER — IOHEXOL 300 MG/ML  SOLN
100.0000 mL | Freq: Once | INTRAMUSCULAR | Status: AC | PRN
  Administered 2024-10-16: 100 mL via INTRAVENOUS
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
## 2024-10-16 NOTE — ED Notes (Signed)
 AVS provided by edp was reviewed with pt. Pt verbalized understanding with no additional questions at this time. Pt going home with family/friend at bedside.
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
## 2024-10-16 NOTE — ED Notes (Signed)
 Patient transported to CT

## 2024-12-15 ENCOUNTER — Ambulatory Visit: Admitting: Surgical
